# Patient Record
Sex: Female | Born: 1980
Health system: Southern US, Community
[De-identification: ages and names within clinical notes are randomized; demographics above are authoritative.]

## PROBLEM LIST (undated history)

## (undated) DIAGNOSIS — Z8619 Personal history of other infectious and parasitic diseases: Secondary | ICD-10-CM

## (undated) DIAGNOSIS — F419 Anxiety disorder, unspecified: Secondary | ICD-10-CM

## (undated) DIAGNOSIS — O24419 Gestational diabetes mellitus in pregnancy, unspecified control: Secondary | ICD-10-CM

## (undated) DIAGNOSIS — F40243 Fear of flying: Secondary | ICD-10-CM

## (undated) DIAGNOSIS — R87629 Unspecified abnormal cytological findings in specimens from vagina: Secondary | ICD-10-CM

## (undated) DIAGNOSIS — Q699 Polydactyly, unspecified: Secondary | ICD-10-CM

## (undated) DIAGNOSIS — G44209 Tension-type headache, unspecified, not intractable: Secondary | ICD-10-CM

## (undated) DIAGNOSIS — B009 Herpesviral infection, unspecified: Secondary | ICD-10-CM

## (undated) DIAGNOSIS — I8390 Asymptomatic varicose veins of unspecified lower extremity: Secondary | ICD-10-CM

## (undated) HISTORY — DX: Fear of flying: F40.243

## (undated) HISTORY — DX: Polydactyly, unspecified: Q69.9

## (undated) HISTORY — DX: Unspecified abnormal cytological findings in specimens from vagina: R87.629

## (undated) HISTORY — DX: Personal history of other infectious and parasitic diseases: Z86.19

## (undated) HISTORY — DX: Gestational diabetes mellitus in pregnancy, unspecified control: O24.419

## (undated) HISTORY — DX: Herpesviral infection, unspecified: B00.9

## (undated) HISTORY — DX: Asymptomatic varicose veins of unspecified lower extremity: I83.90

## (undated) HISTORY — PX: NO PAST SURGERIES: SHX2092

## (undated) HISTORY — DX: Anxiety disorder, unspecified: F41.9

## (undated) HISTORY — DX: Tension-type headache, unspecified, not intractable: G44.209

---

## 2002-09-24 ENCOUNTER — Other Ambulatory Visit: Admission: RE | Admit: 2002-09-24 | Discharge: 2002-09-24 | Payer: Self-pay | Admitting: Obstetrics and Gynecology

## 2003-02-13 ENCOUNTER — Inpatient Hospital Stay (HOSPITAL_COMMUNITY): Admission: AD | Admit: 2003-02-13 | Discharge: 2003-02-13 | Payer: Self-pay | Admitting: Obstetrics and Gynecology

## 2003-02-17 ENCOUNTER — Inpatient Hospital Stay (HOSPITAL_COMMUNITY): Admission: AD | Admit: 2003-02-17 | Discharge: 2003-02-18 | Payer: Self-pay | Admitting: Obstetrics and Gynecology

## 2003-02-22 ENCOUNTER — Inpatient Hospital Stay (HOSPITAL_COMMUNITY): Admission: AD | Admit: 2003-02-22 | Discharge: 2003-02-22 | Payer: Self-pay | Admitting: Obstetrics and Gynecology

## 2003-02-22 ENCOUNTER — Encounter: Payer: Self-pay | Admitting: Obstetrics and Gynecology

## 2003-03-18 ENCOUNTER — Inpatient Hospital Stay (HOSPITAL_COMMUNITY): Admission: AD | Admit: 2003-03-18 | Discharge: 2003-03-19 | Payer: Self-pay | Admitting: *Deleted

## 2003-03-22 ENCOUNTER — Inpatient Hospital Stay (HOSPITAL_COMMUNITY): Admission: AD | Admit: 2003-03-22 | Discharge: 2003-03-23 | Payer: Self-pay | Admitting: Obstetrics and Gynecology

## 2003-03-31 ENCOUNTER — Inpatient Hospital Stay (HOSPITAL_COMMUNITY): Admission: AD | Admit: 2003-03-31 | Discharge: 2003-03-31 | Payer: Self-pay | Admitting: Obstetrics and Gynecology

## 2003-04-07 ENCOUNTER — Inpatient Hospital Stay (HOSPITAL_COMMUNITY): Admission: AD | Admit: 2003-04-07 | Discharge: 2003-04-07 | Payer: Self-pay | Admitting: Obstetrics and Gynecology

## 2003-04-17 ENCOUNTER — Inpatient Hospital Stay (HOSPITAL_COMMUNITY): Admission: AD | Admit: 2003-04-17 | Discharge: 2003-04-19 | Payer: Self-pay | Admitting: Obstetrics and Gynecology

## 2003-05-14 ENCOUNTER — Inpatient Hospital Stay (HOSPITAL_COMMUNITY): Admission: AD | Admit: 2003-05-14 | Discharge: 2003-05-14 | Payer: Self-pay | Admitting: Obstetrics and Gynecology

## 2003-06-27 ENCOUNTER — Other Ambulatory Visit: Admission: RE | Admit: 2003-06-27 | Discharge: 2003-06-27 | Payer: Self-pay | Admitting: Obstetrics and Gynecology

## 2003-11-24 ENCOUNTER — Emergency Department (HOSPITAL_COMMUNITY): Admission: EM | Admit: 2003-11-24 | Discharge: 2003-11-24 | Payer: Self-pay | Admitting: Emergency Medicine

## 2005-08-11 ENCOUNTER — Ambulatory Visit: Payer: Self-pay | Admitting: Family Medicine

## 2005-08-25 ENCOUNTER — Ambulatory Visit: Payer: Self-pay | Admitting: Family Medicine

## 2005-08-25 ENCOUNTER — Other Ambulatory Visit: Admission: RE | Admit: 2005-08-25 | Discharge: 2005-08-25 | Payer: Self-pay | Admitting: Family Medicine

## 2005-11-09 ENCOUNTER — Ambulatory Visit: Payer: Self-pay | Admitting: Family Medicine

## 2006-03-10 ENCOUNTER — Ambulatory Visit: Payer: Self-pay | Admitting: Family Medicine

## 2006-03-22 DIAGNOSIS — G44209 Tension-type headache, unspecified, not intractable: Secondary | ICD-10-CM | POA: Insufficient documentation

## 2006-12-20 ENCOUNTER — Telehealth: Payer: Self-pay | Admitting: Family Medicine

## 2007-02-21 ENCOUNTER — Ambulatory Visit: Payer: Self-pay | Admitting: Family Medicine

## 2007-02-21 DIAGNOSIS — N920 Excessive and frequent menstruation with regular cycle: Secondary | ICD-10-CM | POA: Insufficient documentation

## 2007-02-21 LAB — CONVERTED CEMR LAB: Beta hcg, urine, semiquantitative: NEGATIVE

## 2007-05-15 ENCOUNTER — Ambulatory Visit: Payer: Self-pay | Admitting: Family Medicine

## 2007-05-15 ENCOUNTER — Encounter: Admission: RE | Admit: 2007-05-15 | Discharge: 2007-05-15 | Payer: Self-pay | Admitting: Family Medicine

## 2007-05-15 DIAGNOSIS — R042 Hemoptysis: Secondary | ICD-10-CM | POA: Insufficient documentation

## 2007-05-15 DIAGNOSIS — M25519 Pain in unspecified shoulder: Secondary | ICD-10-CM | POA: Insufficient documentation

## 2007-05-15 DIAGNOSIS — M79609 Pain in unspecified limb: Secondary | ICD-10-CM | POA: Insufficient documentation

## 2007-05-16 LAB — CONVERTED CEMR LAB
ALT: 17 U/L
AST: 16 U/L
Albumin: 4.4 g/dL
Alkaline Phosphatase: 66 U/L
BUN: 15 mg/dL
CO2: 24 meq/L
Calcium: 9 mg/dL
Chloride: 104 meq/L
Creatinine, Ser: 0.76 mg/dL
Glucose, Bld: 96 mg/dL
HCT: 41 %
Hemoglobin: 13.5 g/dL
MCHC: 32.9 g/dL
MCV: 86.3 fL
Platelets: 260 K/uL
Potassium: 4.4 meq/L
RBC: 4.75 M/uL
RDW: 12.6 %
Rheumatoid fact SerPl-aCnc: <20 [IU]/mL
Sed Rate: 6 mm/h
Sodium: 138 meq/L
TSH: 0.827 u[IU]/mL
Total Bilirubin: 0.4 mg/dL
Total Protein: 7.1 g/dL
WBC: 5.1 10*3/microliter

## 2007-05-19 ENCOUNTER — Telehealth (INDEPENDENT_AMBULATORY_CARE_PROVIDER_SITE_OTHER): Payer: Self-pay | Admitting: *Deleted

## 2007-05-25 ENCOUNTER — Ambulatory Visit: Payer: Self-pay | Admitting: Family Medicine

## 2007-06-29 ENCOUNTER — Encounter: Payer: Self-pay | Admitting: Family Medicine

## 2007-08-11 ENCOUNTER — Telehealth (INDEPENDENT_AMBULATORY_CARE_PROVIDER_SITE_OTHER): Payer: Self-pay | Admitting: *Deleted

## 2008-07-04 ENCOUNTER — Ambulatory Visit: Payer: Self-pay | Admitting: Family Medicine

## 2008-07-04 DIAGNOSIS — F409 Phobic anxiety disorder, unspecified: Secondary | ICD-10-CM | POA: Insufficient documentation

## 2008-07-04 DIAGNOSIS — B009 Herpesviral infection, unspecified: Secondary | ICD-10-CM | POA: Insufficient documentation

## 2009-03-14 ENCOUNTER — Other Ambulatory Visit: Admission: RE | Admit: 2009-03-14 | Discharge: 2009-03-14 | Payer: Self-pay | Admitting: Family Medicine

## 2011-09-06 ENCOUNTER — Other Ambulatory Visit (HOSPITAL_COMMUNITY)
Admission: RE | Admit: 2011-09-06 | Discharge: 2011-09-06 | Disposition: A | Discharge status: Home / Self Care | Payer: BC Managed Care – PPO | Source: Ambulatory Visit | Attending: Obstetrics and Gynecology | Admitting: Obstetrics and Gynecology

## 2011-09-06 DIAGNOSIS — Z1159 Encounter for screening for other viral diseases: Secondary | ICD-10-CM | POA: Insufficient documentation

## 2011-09-06 DIAGNOSIS — Z01419 Encounter for gynecological examination (general) (routine) without abnormal findings: Secondary | ICD-10-CM | POA: Insufficient documentation

## 2014-03-29 ENCOUNTER — Other Ambulatory Visit (HOSPITAL_COMMUNITY)
Admission: RE | Admit: 2014-03-29 | Discharge: 2014-03-29 | Disposition: A | Discharge status: Home / Self Care | Payer: BC Managed Care – PPO | Source: Ambulatory Visit | Attending: Obstetrics and Gynecology | Admitting: Obstetrics and Gynecology

## 2014-03-29 ENCOUNTER — Other Ambulatory Visit: Payer: Self-pay | Admitting: Obstetrics and Gynecology

## 2014-03-29 DIAGNOSIS — Z1151 Encounter for screening for human papillomavirus (HPV): Secondary | ICD-10-CM | POA: Insufficient documentation

## 2014-03-29 DIAGNOSIS — Z01419 Encounter for gynecological examination (general) (routine) without abnormal findings: Secondary | ICD-10-CM | POA: Diagnosis not present

## 2014-04-01 LAB — CYTOLOGY - PAP

## 2014-04-12 ENCOUNTER — Other Ambulatory Visit: Payer: Self-pay | Admitting: Obstetrics & Gynecology

## 2014-06-14 NOTE — L&D Delivery Note (Signed)
Final Labor Progress Note VE at 0345 C/C/+2.  Pt sleeping, no urge to push, allowed to labor down  Vaginal Delivery Note The pt utilized an epidural as pain management.   Spontaneous rupture of membranes today, at 2348, clear.  GBS negative.  Cervical dilation was complete at  0345.  NICHD Category 1.    Pushing with guidance began at  0446.   After 11 minutes of pushing the head, shoulders and the body of a viable female infant "Eulah Pont" delivered spontaneously with maternal effort in the ROA position at 0457.  With vigorous tone and spontaneous cry, the infant was placed on moms abd.  After the umbilical cord was clamped it was cut by the FOB, then cord blood was obtained for evaluation.  Spontaneous delivery of a intact placenta with a 3 vessel cord via Shultz at [phone].   Episiotomy: None   The vulva, perineum, vaginal vault, rectum and cervix were inspected and revealed a periclitoral and a right sulcus which was repaired using a 4-0 vicryl on a SH needle.  Lidocaine was not used, the epidural was sufficient for the repair.    Postpartum pitocin as ordered.  Fundus firm, lochia minimum, bleeding under control. EBL 150, QBL pending. Pt hemodynamically stable.   Sponge, laps and needle count correct and verified with the primary care nurse.  Attending MD available at all times.    Routine postpartum orders   Mother unsure about method of contraception, considering IUD   Placenta to pathology: NO     Cord Gases sent to lab: NO Cord blood sent to lab: YES   APGARS:  9 at 1 minute and 9 at 5 minutes Weight:. pending     Both mom and baby were left in stable condition, baby skin to skin.      Yenni Carra, CNM, MSN 02/16/2015. 5:37 AM

## 2014-07-25 LAB — OB RESULTS CONSOLE ABO/RH: RH Type: POSITIVE

## 2014-07-25 LAB — OB RESULTS CONSOLE HIV ANTIBODY (ROUTINE TESTING): HIV: NONREACTIVE

## 2014-07-25 LAB — OB RESULTS CONSOLE HEPATITIS B SURFACE ANTIGEN: Hepatitis B Surface Ag: NEGATIVE

## 2014-07-25 LAB — OB RESULTS CONSOLE GC/CHLAMYDIA
Chlamydia: NEGATIVE
Gonorrhea: NEGATIVE

## 2014-07-25 LAB — OB RESULTS CONSOLE ANTIBODY SCREEN: Antibody Screen: NEGATIVE

## 2014-07-25 LAB — OB RESULTS CONSOLE RUBELLA ANTIBODY, IGM: Rubella: IMMUNE

## 2014-07-25 LAB — OB RESULTS CONSOLE RPR: RPR: NONREACTIVE

## 2014-12-11 ENCOUNTER — Encounter: Payer: BLUE CROSS/BLUE SHIELD | Attending: Obstetrics and Gynecology

## 2014-12-11 VITALS — Ht 61.0 in | Wt 193.0 lb

## 2014-12-11 DIAGNOSIS — Z713 Dietary counseling and surveillance: Secondary | ICD-10-CM | POA: Diagnosis not present

## 2014-12-11 DIAGNOSIS — O24410 Gestational diabetes mellitus in pregnancy, diet controlled: Secondary | ICD-10-CM | POA: Diagnosis present

## 2014-12-11 NOTE — Progress Notes (Signed)
  Patient was seen on 12/11/14 for Gestational Diabetes self-management class at the [facility]. The following learning objectives were met by the patient during this course:   States the definition of Gestational Diabetes  States why dietary management is important in controlling blood glucose  Describes the effects each nutrient has on blood glucose levels  Demonstrates ability to create a balanced meal plan  Demonstrates carbohydrate counting   States when to check blood glucose levels  Demonstrates proper blood glucose monitoring techniques  States the effect of stress and exercise on blood glucose levels  States the importance of limiting caffeine and abstaining from alcohol and smoking  Blood glucose monitor given: NO, patient has her own meter already  Patient instructed to monitor glucose levels: FBS: 60 - <90 2 hour: <120  *Patient received handouts:  Nutrition Diabetes and Pregnancy  Carbohydrate Counting List  Patient will be seen for follow-up as needed.

## 2015-02-13 ENCOUNTER — Telehealth (HOSPITAL_COMMUNITY): Payer: Self-pay | Admitting: *Deleted

## 2015-02-13 ENCOUNTER — Encounter (HOSPITAL_COMMUNITY): Payer: Self-pay | Admitting: *Deleted

## 2015-02-13 LAB — OB RESULTS CONSOLE GBS: GBS: NEGATIVE

## 2015-02-13 NOTE — Telephone Encounter (Signed)
Preadmission screen  

## 2015-02-15 ENCOUNTER — Inpatient Hospital Stay (HOSPITAL_COMMUNITY): Payer: Medicaid Other | Admitting: Anesthesiology

## 2015-02-15 ENCOUNTER — Encounter (HOSPITAL_COMMUNITY): Payer: Self-pay

## 2015-02-15 ENCOUNTER — Inpatient Hospital Stay (HOSPITAL_COMMUNITY)
Admission: AD | Admit: 2015-02-15 | Discharge: 2015-02-17 | DRG: 775 | Disposition: A | Discharge status: Home / Self Care | Payer: Medicaid Other | Source: Ambulatory Visit | Attending: Obstetrics and Gynecology | Admitting: Obstetrics and Gynecology

## 2015-02-15 DIAGNOSIS — O24420 Gestational diabetes mellitus in childbirth, diet controlled: Secondary | ICD-10-CM | POA: Diagnosis present

## 2015-02-15 DIAGNOSIS — Z833 Family history of diabetes mellitus: Secondary | ICD-10-CM

## 2015-02-15 DIAGNOSIS — O9962 Diseases of the digestive system complicating childbirth: Secondary | ICD-10-CM | POA: Diagnosis present

## 2015-02-15 DIAGNOSIS — O09523 Supervision of elderly multigravida, third trimester: Secondary | ICD-10-CM

## 2015-02-15 DIAGNOSIS — Z3A38 38 weeks gestation of pregnancy: Secondary | ICD-10-CM | POA: Diagnosis present

## 2015-02-15 DIAGNOSIS — K219 Gastro-esophageal reflux disease without esophagitis: Secondary | ICD-10-CM | POA: Diagnosis present

## 2015-02-15 LAB — CBC
HEMATOCRIT: 36.2 % (ref 36.0–46.0)
HEMOGLOBIN: 12.4 g/dL (ref 12.0–15.0)
MCH: 27.4 pg (ref 26.0–34.0)
MCHC: 34.3 g/dL (ref 30.0–36.0)
MCV: 80.1 fL (ref 78.0–100.0)
Platelets: 169 10*3/uL (ref 150–400)
RBC: 4.52 MIL/uL (ref 3.87–5.11)
RDW: 14.2 % (ref 11.5–15.5)
WBC: 7.8 10*3/uL (ref 4.0–10.5)

## 2015-02-15 LAB — TYPE AND SCREEN
ABO/RH(D): AB POS
Antibody Screen: NEGATIVE

## 2015-02-15 MED ORDER — FENTANYL 2.5 MCG/ML BUPIVACAINE 1/10 % EPIDURAL INFUSION (WH - ANES)
12.0000 mL/h | INTRAMUSCULAR | Status: DC | PRN
  Administered 2015-02-15: 12 mL/h via EPIDURAL

## 2015-02-15 MED ORDER — DIPHENHYDRAMINE HCL 50 MG/ML IJ SOLN
12.5000 mg | INTRAMUSCULAR | Status: DC | PRN
Start: 2015-02-15 — End: 2015-02-16

## 2015-02-15 MED ORDER — EPHEDRINE 5 MG/ML INJ
10.0000 mg | INTRAVENOUS | Status: DC | PRN
  Filled 2015-02-15: qty 2

## 2015-02-15 MED ORDER — CITRIC ACID-SODIUM CITRATE 334-500 MG/5ML PO SOLN: 30.0000 mL | ORAL | Status: DC | PRN

## 2015-02-15 MED ORDER — NALBUPHINE HCL 10 MG/ML IJ SOLN
5.0000 mg | INTRAMUSCULAR | Status: DC | PRN
  Administered 2015-02-15: 5 mg via INTRAVENOUS
  Filled 2015-02-15 (×2): qty 0.5

## 2015-02-15 MED ORDER — DIPHENHYDRAMINE HCL 50 MG/ML IJ SOLN: 12.5000 mg | INTRAMUSCULAR | Status: DC | PRN

## 2015-02-15 MED ORDER — LIDOCAINE HCL (PF) 1 % IJ SOLN
30.0000 mL | INTRAMUSCULAR | Status: DC | PRN
  Filled 2015-02-15: qty 30

## 2015-02-15 MED ORDER — PHENYLEPHRINE 40 MCG/ML (10ML) SYRINGE FOR IV PUSH (FOR BLOOD PRESSURE SUPPORT): 80.0000 ug | PREFILLED_SYRINGE | INTRAVENOUS | Status: DC | PRN

## 2015-02-15 MED ORDER — LACTATED RINGERS IV SOLN
INTRAVENOUS | Status: DC
  Administered 2015-02-15: 23:00:00 via INTRAVENOUS

## 2015-02-15 MED ORDER — ONDANSETRON HCL 4 MG/2ML IJ SOLN
4.0000 mg | Freq: Four times a day (QID) | INTRAMUSCULAR | Status: DC | PRN
  Administered 2015-02-16: 4 mg via INTRAVENOUS
  Filled 2015-02-15: qty 2

## 2015-02-15 MED ORDER — OXYTOCIN 40 UNITS IN LACTATED RINGERS INFUSION - SIMPLE MED
62.5000 mL/h | INTRAVENOUS | Status: DC
  Filled 2015-02-15: qty 1000

## 2015-02-15 MED ORDER — LIDOCAINE HCL (PF) 1 % IJ SOLN
INTRAMUSCULAR | Status: DC | PRN
  Administered 2015-02-15 (×2): 4 mL via EPIDURAL

## 2015-02-15 MED ORDER — OXYCODONE-ACETAMINOPHEN 5-325 MG PO TABS: 1.0000 | ORAL_TABLET | ORAL | Status: DC | PRN

## 2015-02-15 MED ORDER — OXYCODONE-ACETAMINOPHEN 5-325 MG PO TABS: 2.0000 | ORAL_TABLET | ORAL | Status: DC | PRN

## 2015-02-15 MED ORDER — LACTATED RINGERS IV SOLN
500.0000 mL | INTRAVENOUS | Status: DC | PRN
  Administered 2015-02-15: 500 mL via INTRAVENOUS

## 2015-02-15 MED ORDER — ACETAMINOPHEN 325 MG PO TABS: 650.0000 mg | ORAL_TABLET | ORAL | Status: DC | PRN

## 2015-02-15 MED ORDER — PHENYLEPHRINE 40 MCG/ML (10ML) SYRINGE FOR IV PUSH (FOR BLOOD PRESSURE SUPPORT)
80.0000 ug | PREFILLED_SYRINGE | INTRAVENOUS | Status: DC | PRN
  Filled 2015-02-15: qty 20
  Filled 2015-02-15: qty 2

## 2015-02-15 MED ORDER — FENTANYL 2.5 MCG/ML BUPIVACAINE 1/10 % EPIDURAL INFUSION (WH - ANES)
14.0000 mL/h | INTRAMUSCULAR | Status: DC | PRN
  Filled 2015-02-15: qty 125

## 2015-02-15 MED ORDER — OXYTOCIN BOLUS FROM INFUSION
500.0000 mL | INTRAVENOUS | Status: DC
  Administered 2015-02-16: 500 mL via INTRAVENOUS

## 2015-02-15 NOTE — MAU Note (Signed)
Pt presents complaining of contractions every 5-8 minutes with bloody show. Denies leaking. Reports good fetal movement.

## 2015-02-15 NOTE — Anesthesia Procedure Notes (Signed)
Epidural Patient location during procedure: OB Start time: 02/15/2015 11:34 PM  Staffing Anesthesiologist: Mal Amabile Performed by: anesthesiologist   Preanesthetic Checklist Completed: patient identified, site marked, surgical consent, pre-op evaluation, timeout performed, IV checked, risks and benefits discussed and monitors and equipment checked  Epidural Patient position: sitting Prep: site prepped and draped and DuraPrep Patient monitoring: continuous pulse ox and blood pressure Approach: midline Location: L3-L4 Injection technique: LOR air  Needle:  Needle type: Tuohy  Needle gauge: 17 G Needle length: 9 cm and 9 Needle insertion depth: 5 cm cm Catheter type: closed end flexible Catheter size: 19 Gauge Catheter at skin depth: 10 cm Test dose: negative and Other  Assessment Events: blood not aspirated, injection not painful, no injection resistance, negative IV test and no paresthesia  Additional Notes Patient identified. Risks and benefits discussed including failed block, incomplete  Pain control, post dural puncture headache, nerve damage, paralysis, blood pressure Changes, nausea, vomiting, reactions to medications-both toxic and allergic and post Partum back pain. All questions were answered. Patient expressed understanding and wished to proceed. Sterile technique was used throughout procedure. Epidural site was Dressed with sterile barrier dressing. No paresthesias, signs of intravascular injection Or signs of intrathecal spread were encountered.  Patient was more comfortable after the epidural was dosed. Please see RN's note for documentation of vital signs and FHR which are stable.

## 2015-02-15 NOTE — Anesthesia Preprocedure Evaluation (Signed)
Anesthesia Evaluation  Patient identified by MRN, date of birth, ID band Patient awake    Reviewed: Allergy & Precautions, Patient's Chart, lab work & pertinent test results  Airway Mallampati: II  TM Distance: >3 FB Neck ROM: Full    Dental no notable dental hx. (+) Teeth Intact   Pulmonary neg pulmonary ROS,  breath sounds clear to auscultation  Pulmonary exam normal       Cardiovascular negative cardio ROS Normal cardiovascular examRhythm:Regular Rate:Normal     Neuro/Psych  Headaches, Anxiety    GI/Hepatic Neg liver ROS, GERD-  ,  Endo/Other  diabetes, Well Controlled, GestationalObesity  Renal/GU negative Renal ROS  negative genitourinary   Musculoskeletal negative musculoskeletal ROS (+)   Abdominal (+) + obese,   Peds  Hematology   Anesthesia Other Findings   Reproductive/Obstetrics (+) Pregnancy HSV                             Anesthesia Physical Anesthesia Plan  ASA: II  Anesthesia Plan: Epidural   Post-op Pain Management:    Induction:   Airway Management Planned: Natural Airway  Additional Equipment:   Intra-op Plan:   Post-operative Plan:   Informed Consent: I have reviewed the patients History and Physical, chart, labs and discussed the procedure including the risks, benefits and alternatives for the proposed anesthesia with the patient or authorized representative who has indicated his/her understanding and acceptance.     Plan Discussed with: Anesthesiologist  Anesthesia Plan Comments:         Anesthesia Quick Evaluation

## 2015-02-15 NOTE — Plan of Care (Signed)
Problem: Consults Goal: Birthing Suites Patient Information Press F2 to bring up selections list Outcome: Completed/Met Date Met:  02/15/15  Pt 37-[redacted] weeks EGA

## 2015-02-15 NOTE — Progress Notes (Signed)
Notified of cervical exam and pt discomfort. Will have patient walk and recheck in 1 hour

## 2015-02-15 NOTE — H&P (Signed)
Cathy Barron is a 34 y.o. female, G5 P3013 at 38.4 weeks arrived to MAU c/o ctx.  She denies vb orlof w/+FM.  She report all of her deliveries were vaginal and uncomplicated. She believes her GBS status is negative.  PN records are not available past 28 weeks but it is noted that GBS is negative on 02/13/15.  Pt report HSV type 1, on Valtrex PRN, no current out break  Patient Active Problem List   Diagnosis Date Noted  . HSV 07/04/2008  . PHOBIA, UNSPECIFIED 07/04/2008  . SHOULDER PAIN, RIGHT 05/15/2007  . HAND PAIN 05/15/2007  . HEMOPTYSIS 05/15/2007  . MENORRHAGIA 02/21/2007  . HEADACHE, TENSION 03/22/2006    Pregnancy Course: Patient entered care at 9.2 weeks.   EDC of 02/25/15 was established by [place]    [place] evaluations:    weeks - Dating:    weeks - Anatomy:      weeks - FU:   Significant prenatal events:   AMA, ETOH - beer   Reason for admission:  labor  Pt States:   Contractions Frequency: 3         Contraction severity: moderate-strong         Fetal activity: +FM  OB History    Gravida Para Term Preterm AB TAB SAB Ectopic Multiple Living   5 3 3  1  1   3      Past Medical History  Diagnosis Date  . GDM (gestational diabetes mellitus)   . Vaginal Pap smear, abnormal   . Varicose vein   . Hx of varicella   . Herpes   . Headache   . Polydactylia   . Gestational diabetes     diet controlled   Past Surgical History  Procedure Laterality Date  . No past surgeries     Family History: family history includes Diabetes in her father; Liver disease in her father; Varicose Veins in her mother. Social History:  reports that she has never smoked. She has never used smokeless tobacco. She reports that she does not drink alcohol or use illicit drugs.   Prenatal Transfer Tool  Maternal Diabetes: Pt denies Genetic Screening:  Maternal Ultrasounds/Referrals:  Fetal Ultrasounds or other Referrals:   Maternal Substance Abuse:   Significant Maternal Medications:  Meds  include: Other: valtrex and Klonipin Significant Maternal Lab Results: None   ROS:  See HPI above, all other systems are negative  No Known Allergies  Dilation: 5 Effacement (%): 100 Station: -3 Exam by:: Sharen Hint RNC Blood pressure 127/78, pulse 76, temperature 98.6 F (37 C), resp. rate 16, last menstrual period 04/23/2014.  Maternal Exam:  Uterine Assessment: Contraction frequency is rare.  Abdomen: Gravid, non tender. Fundal height is aga.  Normal external genitalia, vulva, cervix, uterus and adnexa.  No lesions noted on exam.  Pelvis adequate for delivery.  Fetal presentation: Vertex by Leopold's  Fetal Exam:  Monitor Surveillance : Continuous Monitoring Mode: Ultrasound.  NICHD: Category  CTXs: Q EFW   7 lbs  Physical Exam: Nursing note and vitals reviewed General: alert and cooperative She appears well nourished Psychiatric: Normal mood and affect. Her behavior is normal Head: Normocephalic Eyes: Pupils are equal, round, and reactive to light Neck: Normal range of motion Cardiovascular: RRR without murmur  Respiratory: CTAB. Effort normal  Abd: soft, non-tender, +BS, no rebound, no guarding  Genitourinary: Vagina normal  Neurological: A&Ox3 Skin: Warm and dry  Musculoskeletal: Normal range of motion  Homan's sign negative bilaterally No evidence  of DVTs.  Edema: Minimal bilaterally non-pitting edema DTR: 2+ Clonus: None   Prenatal labs: ABO, Rh: AB/Positive/-- (02/11 0000) Antibody: Negative (02/11 0000) Rubella:   immune RPR: Nonreactive (02/11 0000)  HBsAg: Negative (02/11 0000)  HIV: Non-reactive (02/11 0000)  GBS: Negative (09/01 0000) Sickle cell/Hgb electrophoresis:  WNL Pap:   GC:   Negative Chlamydia: negative Genetic screenings:   Glucola:  GDM   Assessment:  IUP at 38.4 weeks NICHD: Category Membranes: BBW per nurse exam GBS negative  Plan:  Admit to L&D for expectant management of labor. Possible  augmentation options reviewed including foley bulb, AROM and/or pitocin.  IV pain medication per orders PRN Epidural per patient request Foley cath after patient is comfortable with epidural Anticipate SVD Labor mgmt as ordered    Attending MD available at all times.  Zosia Lucchese, CNM, MSN 02/15/2015, 10:57 PM

## 2015-02-16 ENCOUNTER — Encounter (HOSPITAL_COMMUNITY): Payer: Self-pay

## 2015-02-16 LAB — GLUCOSE, CAPILLARY
Glucose-Capillary: 77 mg/dL (ref 65–99)
Glucose-Capillary: 90 mg/dL (ref 65–99)

## 2015-02-16 LAB — ABO/RH: ABO/RH(D): AB POS

## 2015-02-16 LAB — RPR: RPR: NONREACTIVE

## 2015-02-16 MED ORDER — PRENATAL MULTIVITAMIN CH
1.0000 | ORAL_TABLET | Freq: Every day | ORAL | Status: DC
  Administered 2015-02-16 – 2015-02-17 (×2): 1 via ORAL
  Filled 2015-02-16 (×2): qty 1

## 2015-02-16 MED ORDER — SIMETHICONE 80 MG PO CHEW: 80.0000 mg | CHEWABLE_TABLET | ORAL | Status: DC | PRN

## 2015-02-16 MED ORDER — IBUPROFEN 600 MG PO TABS
600.0000 mg | ORAL_TABLET | Freq: Four times a day (QID) | ORAL | Status: DC
  Administered 2015-02-16 – 2015-02-17 (×7): 600 mg via ORAL
  Filled 2015-02-16 (×7): qty 1

## 2015-02-16 MED ORDER — ACETAMINOPHEN 325 MG PO TABS: 650.0000 mg | ORAL_TABLET | ORAL | Status: DC | PRN

## 2015-02-16 MED ORDER — BENZOCAINE-MENTHOL 20-0.5 % EX AERO
1.0000 "application " | INHALATION_SPRAY | CUTANEOUS | Status: DC | PRN
  Administered 2015-02-16: 1 via TOPICAL
  Filled 2015-02-16: qty 56

## 2015-02-16 MED ORDER — TETANUS-DIPHTH-ACELL PERTUSSIS 5-2.5-18.5 LF-MCG/0.5 IM SUSP: 0.5000 mL | Freq: Once | INTRAMUSCULAR | Status: DC

## 2015-02-16 MED ORDER — INFLUENZA VAC SPLIT QUAD 0.5 ML IM SUSY
0.5000 mL | PREFILLED_SYRINGE | INTRAMUSCULAR | Status: AC
  Administered 2015-02-17: 0.5 mL via INTRAMUSCULAR

## 2015-02-16 MED ORDER — OXYCODONE-ACETAMINOPHEN 5-325 MG PO TABS: 2.0000 | ORAL_TABLET | ORAL | Status: DC | PRN

## 2015-02-16 MED ORDER — DIPHENHYDRAMINE HCL 25 MG PO CAPS: 25.0000 mg | ORAL_CAPSULE | Freq: Four times a day (QID) | ORAL | Status: DC | PRN

## 2015-02-16 MED ORDER — OXYCODONE-ACETAMINOPHEN 5-325 MG PO TABS: 1.0000 | ORAL_TABLET | ORAL | Status: DC | PRN

## 2015-02-16 MED ORDER — LANOLIN HYDROUS EX OINT: TOPICAL_OINTMENT | CUTANEOUS | Status: DC | PRN

## 2015-02-16 MED ORDER — ZOLPIDEM TARTRATE 5 MG PO TABS: 5.0000 mg | ORAL_TABLET | Freq: Every evening | ORAL | Status: DC | PRN

## 2015-02-16 MED ORDER — ONDANSETRON HCL 4 MG/2ML IJ SOLN: 4.0000 mg | INTRAMUSCULAR | Status: DC | PRN

## 2015-02-16 MED ORDER — ONDANSETRON HCL 4 MG PO TABS: 4.0000 mg | ORAL_TABLET | ORAL | Status: DC | PRN

## 2015-02-16 MED ORDER — SENNOSIDES-DOCUSATE SODIUM 8.6-50 MG PO TABS
2.0000 | ORAL_TABLET | ORAL | Status: DC
  Administered 2015-02-16: 2 via ORAL
  Filled 2015-02-16: qty 2

## 2015-02-16 MED ORDER — WITCH HAZEL-GLYCERIN EX PADS
1.0000 | MEDICATED_PAD | CUTANEOUS | Status: DC | PRN
Start: 2015-02-16 — End: 2015-02-17

## 2015-02-16 MED ORDER — DIBUCAINE 1 % RE OINT: 1.0000 "application " | TOPICAL_OINTMENT | RECTAL | Status: DC | PRN

## 2015-02-16 NOTE — Lactation Note (Signed)
This note was copied from the chart of Cathy Barron. Lactation Consultation Note Experienced BF mom who had trouble BF all of her some d/t latching issues. Mom has everted nipples, needs more stimulation w/finger tips d/t short shaft nipples. Encouraged to wear shells to assist in everting nipples more. Fitted mom w/#20 NS. Application taught. Mom has large pendulum breast, elevated w/cloth for support. Baby had BF prior to my visit. To sleepy to latch at this time.  Discussed positioning, obtaining deep latching, chin tug, I&O, new born behavior, cluster feeding, supply and demand.  Referred to Baby and Me Book in Breastfeeding section Pg. 22-23 for position options and Proper latch demonstration.Mom encouraged to feed baby 8-12 times/24 hours and with feeding cues. Mom encouraged to waken baby for feeds. Mom encouraged to do skin-to-skin. WH/LC brochure given w/resources, support groups and LC services. Patient Name: Cathy Barron Today's Date: 02/16/2015 Reason for consult: Initial assessment   Maternal Data Has patient been taught Hand Expression?: Yes Does the patient have breastfeeding experience prior to this delivery?: Yes  Feeding Feeding Type: Breast Fed Length of feed: 10 min  LATCH Score/Interventions Latch: Too sleepy or reluctant, no latch achieved, no sucking elicited. Intervention(s): Skin to skin;Teach feeding cues;Waking techniques Intervention(s): Adjust position;Assist with latch;Breast massage;Breast compression  Audible Swallowing: None Intervention(s): Skin to skin;Hand expression  Type of Nipple: Everted at rest and after stimulation  Comfort (Breast/Nipple): Soft / non-tender  Problem noted: Mild/Moderate discomfort  Hold (Positioning): Assistance needed to correctly position infant at breast and maintain latch. Intervention(s): Breastfeeding basics reviewed;Support Pillows;Position options;Skin to skin  LATCH Score: 5  Lactation Tools  Discussed/Used Tools: Shells;Nipple Shields Nipple shield size: 20 Shell Type: Inverted   Consult Status Consult Status: Follow-up Date: 02/17/15 Follow-up type: In-patient    Charyl Dancer 02/16/2015, 6:32 PM

## 2015-02-16 NOTE — Progress Notes (Signed)
Labor Progress  Subjective: No complaints, comfortable with epidural  Objective: BP 127/76 mmHg  Pulse 69  Temp(Src) 98.6 F (37 C)  Resp 18  Ht  (1.549 m)  Wt 189 lb (85.73 kg)  BMI 35.73 kg/m2  SpO2 98%  LMP 04/23/2014     FHT: 110, moderate variability, + accel, no decels CTX:  regular, every 3-4 minutes Uterus gravid, soft non tender SVE:  8-9/80/-1   Assessment:  IUP at 38.5 weeks NICHD: Category  1 Membranes:  SROM x 1hrs, no s/s of infection Labor progress: adquate labor GBS:  negative A1GDM: stable sugars 98   Plan: Continue labor plan Continuous monitoring Frequent position changes to facilitate fetal rotation and descent. Will reassess with cervical exam at 0300 or earlier if necessary       Cathy Barron, CNM, MSN 02/16/2015. 1:41 AM

## 2015-02-16 NOTE — Anesthesia Postprocedure Evaluation (Signed)
  Anesthesia Post-op Note  Patient: Cathy Barron  Procedure(s) Performed: * No procedures listed *  Patient Location: Mother/Baby  Anesthesia Type:Epidural  Level of Consciousness: awake  Airway and Oxygen Therapy: Patient Spontanous Breathing  Post-op Pain: mild  Post-op Assessment: Post-op Vital signs reviewed and Patient's Cardiovascular Status Stable              Post-op Vital Signs: stable  Last Vitals:  Filed Vitals:   02/16/15 1308  BP: 133/76  Pulse: 61  Temp: 36.8 C  Resp: 16    Complications: No apparent anesthesia complications

## 2015-02-17 LAB — CBC
HCT: 31.1 % — ABNORMAL LOW (ref 36.0–46.0)
Hemoglobin: 10.4 g/dL — ABNORMAL LOW (ref 12.0–15.0)
MCH: 27.4 pg (ref 26.0–34.0)
MCHC: 33.4 g/dL (ref 30.0–36.0)
MCV: 81.8 fL (ref 78.0–100.0)
PLATELETS: 131 10*3/uL — AB (ref 150–400)
RBC: 3.8 MIL/uL — AB (ref 3.87–5.11)
RDW: 14.5 % (ref 11.5–15.5)
WBC: 9.8 10*3/uL (ref 4.0–10.5)

## 2015-02-17 MED ORDER — IBUPROFEN 600 MG PO TABS: 600.0000 mg | ORAL_TABLET | Freq: Four times a day (QID) | ORAL | Status: AC | PRN

## 2015-02-17 NOTE — Discharge Instructions (Signed)

## 2015-02-17 NOTE — Discharge Summary (Signed)
  Vaginal Delivery Discharge Summary  Cathy Barron  DOB:    1980-12-30 MRN:    [id number] CSN:    [id number]  Date of admission:                  02/15/15  Date of discharge:                   02/17/15  Procedures this admission:   SVB, repair of periclitoral and right sulcus tear  Date of Delivery: 02/16/15  Newborn Data:  Live born female  Birth Weight: 7 lb 3.3 oz (3268 g) APGAR: 9, 9  Home with mother. Name: Cathy Barron   History of Present Illness:  Ms. Cathy Barron is a 34 y.o. female, [id number], who presents at [redacted]w[redacted]d weeks gestation. The patient has been followed at [facility] by Dr. Dion Body.  She was admitted for onset of labor. Her pregnancy has been complicated by:  Patient Active Problem List   Diagnosis Date Noted  . Normal vaginal delivery 02/16/2015  . HSV 07/04/2008  . PHOBIA, UNSPECIFIED 07/04/2008  . SHOULDER PAIN, RIGHT 05/15/2007  . HAND PAIN 05/15/2007  . HEMOPTYSIS 05/15/2007  . MENORRHAGIA 02/21/2007  . HEADACHE, TENSION 03/22/2006     Hospital Course:   Admitting Dx:  IUP at 38 5/7 weeks, active labor. GBS Status:  Negative Delivering Clinician:  Venus Standard, CNM Lacerations/MLE: periclitoral and right sulcus tear Complications: None  Intrapartum Procedures: spontaneous vaginal delivery Postpartum Procedures: none Complications-Operative and Postpartum: periclitoral and right sulcus tear  Discharge Diagnoses: Term Pregnancy-delivered, SVB  Feeding:  breast  Contraception:  Considering IUD  Hemoglobin Results:  CBC Latest Ref Rng 02/17/2015 02/15/2015 05/15/2007  WBC 4.0 - 10.5 K/uL 9.8 7.8 5.1  Hemoglobin 12.0 - 15.0 g/dL 10.4(L) 12.4 13.5  Hematocrit 36.0 - 46.0 % 31.1(L) 36.2 41.0  Platelets 150 - 400 K/uL 131(L) 169 260    Discharge Physical Exam:   General: alert Lochia: appropriate Uterine Fundus: firm Incision: healing well DVT Evaluation: No evidence of DVT seen on physical exam. Negative Homan's  sign.   Discharge Information:  Activity:           pelvic rest Diet:                routine Medications: Ibuprofen and continue Atarax Condition:      stable Instructions:  Routine pp instructions   Discharge to: home  Follow-up Information    Follow up with [facility]. Schedule an appointment as soon as possible for a visit in 6 weeks.   Specialty:  Obstetrics and Gynecology   Why:  Call for any questions or concerns.   Contact information:   [address] [place] [address] [phone]        Nigel Bridgeman CNM 02/17/2015 9:08 AM

## 2015-02-18 ENCOUNTER — Ambulatory Visit: Payer: Self-pay

## 2015-02-18 ENCOUNTER — Inpatient Hospital Stay (HOSPITAL_COMMUNITY): Admission: RE | Admit: 2015-02-18 | Payer: BLUE CROSS/BLUE SHIELD | Source: Ambulatory Visit

## 2015-02-18 NOTE — Lactation Note (Signed)
This note was copied from the chart of Cathy Dianah Turpin. Lactation Consultation Note  Baby latched in cross cradle position upon entering with #24NS. Sucks and some swallows observed.  Mother states she does not need OP appt. Provided mother w/ comfort gels for sore nipples and an extra #24NS. Reminded mother to try at least once a day without NS and post pump 4-6x a day and give baby back volume pumped. Discussed engorgement care and monitoring voids/stools.  Patient Name: Cathy Barron [id number] Date: 02/18/2015 Reason for consult: Follow-up assessment   Maternal Data    Feeding Feeding Type: Breast Fed Length of feed: 20 min  LATCH Score/Interventions Latch: Grasps breast easily, tongue down, lips flanged, rhythmical sucking. Intervention(s): Skin to skin  Audible Swallowing: A few with stimulation Intervention(s): Skin to skin;Hand expression Intervention(s): Skin to skin;Hand expression (nipple shield)  Type of Nipple: Flat Intervention(s): Double electric pump  Comfort (Breast/Nipple): Filling, red/small blisters or bruises, mild/mod discomfort  Problem noted: Mild/Moderate discomfort Interventions (Mild/moderate discomfort): Hand expression;Breast shields  Hold (Positioning): No assistance needed to correctly position infant at breast. Intervention(s): Breastfeeding basics reviewed;Support Pillows;Skin to skin  LATCH Score: 7  Lactation Tools Discussed/Used     Consult Status Consult Status: Complete    Hardie Pulley 02/18/2015, 10:44 AM

## 2015-02-25 ENCOUNTER — Inpatient Hospital Stay (HOSPITAL_COMMUNITY): Admission: AD | Admit: 2015-02-25 | Payer: Self-pay | Source: Ambulatory Visit | Admitting: Obstetrics and Gynecology

## 2016-03-09 ENCOUNTER — Encounter: Payer: Self-pay | Admitting: Family Medicine

## 2016-03-09 ENCOUNTER — Ambulatory Visit (INDEPENDENT_AMBULATORY_CARE_PROVIDER_SITE_OTHER): Payer: BLUE CROSS/BLUE SHIELD | Admitting: Family Medicine

## 2016-03-09 VITALS — BP 113/77 | HR 76 | Temp 97.8°F | Resp 20 | Ht 62.0 in | Wt 186.2 lb

## 2016-03-09 DIAGNOSIS — Z7189 Other specified counseling: Secondary | ICD-10-CM

## 2016-03-09 DIAGNOSIS — Z6834 Body mass index (BMI) 34.0-34.9, adult: Secondary | ICD-10-CM | POA: Diagnosis not present

## 2016-03-09 DIAGNOSIS — F418 Other specified anxiety disorders: Secondary | ICD-10-CM | POA: Diagnosis not present

## 2016-03-09 DIAGNOSIS — Z23 Encounter for immunization: Secondary | ICD-10-CM

## 2016-03-09 DIAGNOSIS — Z7689 Persons encountering health services in other specified circumstances: Secondary | ICD-10-CM

## 2016-03-09 MED ORDER — VENLAFAXINE HCL ER 75 MG PO CP24
75.0000 mg | ORAL_CAPSULE | Freq: Every day | ORAL | 0 refills | Status: DC
Start: 2016-03-09 — End: 2016-04-06

## 2016-03-09 MED ORDER — VENLAFAXINE HCL ER 37.5 MG PO CP24: 37.5000 mg | ORAL_CAPSULE | Freq: Every day | ORAL | 0 refills | Status: DC

## 2016-03-09 NOTE — Progress Notes (Signed)
Patient ID: Cathy LatheMercedess I Bir, female  DOB: [birth year], 35 y.o.   MRN: [id number] Patient Care Team    Relationship Specialty Notifications Start End  Natalia Leatherwoodenee A Chanie Soucek, DO PCP - General Family Medicine  03/09/16   Geryl RankinsEvelyn Varnado, MD Consulting Physician Obstetrics and Gynecology  03/09/16     Subjective:  Cathy Barron is a 35 y.o.  female present for new patient establishment. All past medical history, surgical history, allergies, family history, immunizations, medications and social history were obtained  in the electronic medical record today. All recent labs, ED visits and hospitalizations within the last year were reviewed.  Anxiety: pre-pregnancy pt states she took klonopin for several years on/off averaging about 2x a week at low dose. She  had also taken vistaril in the pas, which made her very sleepy and she could not function after taking it. She reports she is overwhelmed,  can not not focus,  feels stressed out and feels like she is always trying to get things done, but never actually gets anything completed. She has taken Ativan in the past per EM system for flying phobia.  She states she was also on Adderall at one time for lack of focus, but felt after a few days it made her very angry and irritable. She was able to focus on that medication, but could not tolerate the irritable. .She has a husband and 4 children, some out of the home now, 1 less than 37 years old. She comes in today because she needs to do something, her current state can not be healthy.    Health maintenance:  Colonoscopy: no fhx, screen at 50.  Mammogram: No fhx, screen at 40. Cervical cancer screening: GYN 2015, Dr. Dario GuardianVerando.  Immunizations: tdap 2016, Influenza 2016 (encouraged yearly)--> administered.  Infectious disease screening: HIV completed DEXA: N/A  [facility] 03/09/2016 03/09/2016 12/11/2014  Decreased Interest 1 0 0  Down, Depressed, Hopeless 0 0 0  PHQ - 2 Score 1 0 0  Altered  sleeping 3 - -  Tired, decreased energy 3 - -  Change in appetite 3 - -  Feeling bad or failure about yourself  1 - -  Trouble concentrating 3 - -  Moving slowly or fidgety/restless 0 - -  Suicidal thoughts 0 - -  PHQ-9 Score 14 - -   GAD 7 : Generalized Anxiety Score 03/09/2016  Nervous, Anxious, on Edge 2  Control/stop worrying 2  Worry too much - different things 3  Trouble relaxing 3  Restless 2  Easily annoyed or irritable 3  Afraid - awful might happen 3  Total GAD 7 Score 18  Anxiety Difficulty Very difficult    Mood d/o negative today.   Immunization History  Administered Date(s) Administered  . Influenza,inj,Quad PF,36+ Mos 02/17/2015  . Tdap 06/14/2014     Past Medical History:  Diagnosis Date  . Anxiety   . Flying phobia   . GDM (gestational diabetes mellitus)   . Gestational diabetes    diet controlled  . Herpes   . Hx of varicella   . Polydactylia   . Tension headache   . Vaginal Pap smear, abnormal   . Varicose vein    No Known Allergies Past Surgical History:  Procedure Laterality Date  . NO PAST SURGERIES     Family History  Problem Relation Age of Onset  . Varicose Veins Mother   . Diabetes Father   . Liver disease Father  hepatitis  . Stroke Father    Social History   Social History  . Marital status: Married    Spouse name: N/A  . Number of children: N/A  . Years of education: N/A   Occupational History  . Not on file.   Social History Main Topics  . Smoking status: Former Games developer  . Smokeless tobacco: Never Used  . Alcohol use No  . Drug use: No  . Sexual activity: Yes    Birth control/ protection: IUD   Other Topics Concern  . Not on file   Social History Narrative   Married to [place]. Has 3 children Herbert Seta, [place] and [place]).    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    Wears her seatbelt. Smoke detector in the home.    Feels safe in her relationships.      Medication List       Accurate as of 03/09/16   9:52 AM. Always use your most recent med list.          ibuprofen 600 MG tablet Commonly known as:  ADVIL,MOTRIN Take 1 tablet (600 mg total) by mouth every 6 (six) hours as needed.   valACYclovir 500 MG tablet Commonly known as:  VALTREX TAKE 1 TABLET EVERY 12 HRS X 3 DAYS AS NEEDED ORALLY 3 DAYS   venlafaxine XR 37.5 MG 24 hr capsule Commonly known as:  EFFEXOR XR Take 1 capsule (37.5 mg total) by mouth daily with breakfast.   venlafaxine XR 75 MG 24 hr capsule Commonly known as:  EFFEXOR XR Take 1 capsule (75 mg total) by mouth daily with breakfast.        No results found for this or any previous visit (from the past 2160 hour(s)).  No results found.   ROS: 14 pt review of systems performed and negative (unless mentioned in an HPI)  Objective: BP 113/77 (BP Location: Right Arm, Patient Position: Sitting, Cuff Size: Large)   Pulse 76   Temp 97.8 F (36.6 C)   Resp 20   Ht 5\' 2"  (1.575 m)   Wt 186 lb 4 oz (84.5 kg)   SpO2 98%   BMI 34.07 kg/m  Gen: Afebrile. No acute distress. Nontoxic in appearance, well-developed, well-nourished,  Obese caucasian female.  HENT: AT. [place].  MMM Eyes:Pupils Equal Round Reactive to light, Extraocular movements intact,  Conjunctiva without redness, discharge or icterus. Neck/lymp/endocrine: Supple,no  thyromegaly CV: RRR  Chest: CTAB, no wheeze, rhonchi or crackles. Abd: Soft. NTND. BS present Skin:Warm and well-perfused. Skin intact. Neuro/Msk:  Normal gait. PERLA. EOMi. Alert. Oriented x3.  Psych: Anxious, distracted. Normal dress. Normal speech. Normal thought content and judgment.   Assessment/plan: Cathy Barron is a 35 y.o. female present for establishment of care with complaints of anxiety.  Anxiety associated with depression - Effexor taper dose provided today.  - discussed taper on/taper off medication - venlafaxine XR (EFFEXOR XR) 75 MG 24 hr capsule; Take 1 capsule (75 mg total) by mouth daily with breakfast.   Dispense: 30 capsule; Refill: 0 - f/u 4 weeks.   nfluenza vaccine administered - Flu Vaccine QUAD 36+ mos PF IM (Fluarix & Fluzone Quad PF) - flu shot administered today.    Return in about 4 weeks (around 04/06/2016), or anxiety. Greater than 30 minutes spent with patient, >50% of time spent face to face counseling patient and coordinating care.  Electronically signed by: Felix Pacini, DO [facility] [place]

## 2016-03-09 NOTE — Patient Instructions (Signed)
It was a pleasure meeting you today.  Start effexor 37.5 mg for 7 days in the morning. Then increase to 75 mg daily at day 8.  NEVER stop this medicine abruptly. We will need to see each other in 4 weeks, before medicine needs refilled.

## 2016-03-22 ENCOUNTER — Encounter: Payer: Self-pay | Admitting: Family Medicine

## 2016-04-06 ENCOUNTER — Ambulatory Visit (INDEPENDENT_AMBULATORY_CARE_PROVIDER_SITE_OTHER): Payer: BLUE CROSS/BLUE SHIELD | Admitting: Family Medicine

## 2016-04-06 ENCOUNTER — Encounter: Payer: Self-pay | Admitting: Family Medicine

## 2016-04-06 DIAGNOSIS — F418 Other specified anxiety disorders: Secondary | ICD-10-CM

## 2016-04-06 MED ORDER — VENLAFAXINE HCL ER 75 MG PO CP24: 75.0000 mg | ORAL_CAPSULE | Freq: Every day | ORAL | 0 refills | Status: DC

## 2016-04-06 NOTE — Patient Instructions (Signed)
I have called in 75 mg refills.  Before needing refills again call in, if good on 75 mg dose we refill it ans see you back in January. If needing increase in dose will call that in for you  And need to see you in December.

## 2016-04-06 NOTE — Progress Notes (Signed)
Patient ID: Cathy Barron, female  DOB: 09/18/1980, 35 y.o.   MRN: [id number] Patient Care Team    Relationship Specialty Notifications Start End  Natalia Leatherwoodenee A Kuneff, DO PCP - General Family Medicine  03/09/16   Geryl RankinsEvelyn Varnado, MD Consulting Physician Obstetrics and Gynecology  03/09/16     Subjective:  Cathy Barron is a 35 y.o.  female present for anxiety follow up:   Anxiety: Pt present for anxiety follow up. She has tapered to Effexor 75 mg dose and has not noticed any side effects. She states she has only noticed a small benefit currently.   Prior note:  pre-pregnancy pt states she took klonopin for several years on/off averaging about 2x a week at low dose. She  had also taken vistaril in the pas, which made her very sleepy and she could not function after taking it. She reports she is overwhelmed,  can not not focus,  feels stressed out and feels like she is always trying to get things done, but never actually gets anything completed. She has taken Ativan in the past per EM system for flying phobia.  She states she was also on Adderall at one time for lack of focus, but felt after a few days it made her very angry and irritable. She was able to focus on that medication, but could not tolerate the irritable. .She has a husband and 4 children, some out of the home now, 1 less than 37 years old. She comes in today because she needs to do something, her current state can not be healthy.    [facility] 03/09/2016 03/09/2016 12/11/2014  Decreased Interest 1 0 0  Down, Depressed, Hopeless 0 0 0  PHQ - 2 Score 1 0 0  Altered sleeping 3 - -  Tired, decreased energy 3 - -  Change in appetite 3 - -  Feeling bad or failure about yourself  1 - -  Trouble concentrating 3 - -  Moving slowly or fidgety/restless 0 - -  Suicidal thoughts 0 - -  PHQ-9 Score 14 - -   GAD 7 : Generalized Anxiety Score 03/09/2016  Nervous, Anxious, on Edge 2  Control/stop worrying 2  Worry too much  - different things 3  Trouble relaxing 3  Restless 2  Easily annoyed or irritable 3  Afraid - awful might happen 3  Total GAD 7 Score 18  Anxiety Difficulty Very difficult    Mood d/o negative today.   Immunization History  Administered Date(s) Administered  . Influenza,inj,Quad PF,36+ Mos 02/17/2015, 03/09/2016  . Tdap 06/14/2014     Past Medical History:  Diagnosis Date  . Anxiety   . Flying phobia   . GDM (gestational diabetes mellitus)   . Gestational diabetes    diet controlled  . Herpes   . Hx of varicella   . Polydactylia   . Tension headache   . Vaginal Pap smear, abnormal   . Varicose vein    No Known Allergies Past Surgical History:  Procedure Laterality Date  . NO PAST SURGERIES     Family History  Problem Relation Age of Onset  . Varicose Veins Mother   . Diabetes Father   . Liver disease Father     hepatitis  . Stroke Father    Social History   Social History  . Marital status: Married    Spouse name: N/A  . Number of children: N/A  . Years of education: N/A  Occupational History  . Not on file.   Social History Main Topics  . Smoking status: Former Smoker    Quit date: 2007  . Smokeless tobacco: Never Used  . Alcohol use No  . Drug use: No  . Sexual activity: Yes    Partners: Male    Birth control/ protection: IUD   Other Topics Concern  . Not on file   Social History Narrative   Married to [place]. Has 3 children Herbert Seta, [place] and [place]).    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    Wears her seatbelt. Smoke detector in the home.    Feels safe in her relationships.      Medication List       Accurate as of 04/06/16 10:03 AM. Always use your most recent med list.          ibuprofen 600 MG tablet Commonly known as:  ADVIL,MOTRIN Take 1 tablet (600 mg total) by mouth every 6 (six) hours as needed.   valACYclovir 500 MG tablet Commonly known as:  VALTREX TAKE 1 TABLET EVERY 12 HRS X 3 DAYS AS NEEDED ORALLY 3 DAYS     venlafaxine XR 75 MG 24 hr capsule Commonly known as:  EFFEXOR XR Take 1 capsule (75 mg total) by mouth daily with breakfast.        No results found for this or any previous visit (from the past 2160 hour(s)).  No results found.   ROS: 14 pt review of systems performed and negative (unless mentioned in an HPI)  Objective: BP 102/69 (BP Location: Right Arm, Patient Position: Sitting, Cuff Size: Large)   Pulse 74   Temp 97.6 F (36.4 C)   Resp 20   Ht 5\' 2"  (1.575 m)   Wt 182 lb 8 oz (82.8 kg)   SpO2 98%   BMI 33.38 kg/m  Gen: Afebrile. No acute distress. Nontoxic in appearance, well-developed, well-nourished,  Obese caucasian female.  HENT: AT. [place].  MMM Eyes:Pupils Equal Round Reactive to light, Extraocular movements intact,  Conjunctiva without redness, discharge or icterus. Neuro/Msk:  Normal gait. PERLA. EOMi. Alert. Oriented x3.  Psych: Anxious, distracted. Normal dress. Normal speech. Normal thought content and judgment.   Assessment/plan: KEIRY KOWAL is a 35 y.o. female present for establishment of care with complaints of anxiety.  Anxiety associated with depression - Tolerating medication. Continue 75 mg dosing for 1 additional month to obtain full effectiveness of 75 mg dose. Refills provided today. If doing well follow up in 3 months. If needing dose adjustment in 1 month, she will call in and we will increase and see 1 month after adjustment.  - venlafaxine XR (EFFEXOR XR) 75 MG 24 hr capsule; Take 1 capsule (75 mg total) by mouth daily with breakfast.  Dispense: 30 capsule; Refill: 0 - 3 months if no adjustment needed.    Electronically signed by: Felix Pacini, DO [facility] [place]

## 2016-04-30 ENCOUNTER — Ambulatory Visit: Payer: BLUE CROSS/BLUE SHIELD | Admitting: Family Medicine

## 2016-05-10 ENCOUNTER — Other Ambulatory Visit: Payer: Self-pay | Admitting: Family Medicine

## 2016-05-10 ENCOUNTER — Other Ambulatory Visit: Payer: Self-pay

## 2016-05-10 MED ORDER — VALACYCLOVIR HCL 500 MG PO TABS: ORAL_TABLET | ORAL | 1 refills | Status: AC

## 2016-05-10 NOTE — Telephone Encounter (Signed)
Patient requesting medication. We have not filled in the past.

## 2016-05-10 NOTE — Progress Notes (Signed)
Refilled valtrex  

## 2016-06-09 ENCOUNTER — Ambulatory Visit (INDEPENDENT_AMBULATORY_CARE_PROVIDER_SITE_OTHER): Payer: BLUE CROSS/BLUE SHIELD | Admitting: Family Medicine

## 2016-06-09 ENCOUNTER — Ambulatory Visit (HOSPITAL_BASED_OUTPATIENT_CLINIC_OR_DEPARTMENT_OTHER)
Admission: RE | Admit: 2016-06-09 | Discharge: 2016-06-09 | Disposition: A | Discharge status: Home / Self Care | Payer: BLUE CROSS/BLUE SHIELD | Source: Ambulatory Visit | Attending: Family Medicine | Admitting: Family Medicine

## 2016-06-09 ENCOUNTER — Encounter: Payer: Self-pay | Admitting: Family Medicine

## 2016-06-09 VITALS — BP 108/73 | HR 77 | Temp 98.0°F | Resp 20 | Ht 62.0 in | Wt 190.5 lb

## 2016-06-09 DIAGNOSIS — G8929 Other chronic pain: Secondary | ICD-10-CM

## 2016-06-09 DIAGNOSIS — M79644 Pain in right finger(s): Secondary | ICD-10-CM

## 2016-06-09 DIAGNOSIS — M25572 Pain in left ankle and joints of left foot: Secondary | ICD-10-CM

## 2016-06-09 DIAGNOSIS — M25561 Pain in right knee: Secondary | ICD-10-CM

## 2016-06-09 DIAGNOSIS — M7732 Calcaneal spur, left foot: Secondary | ICD-10-CM | POA: Insufficient documentation

## 2016-06-09 DIAGNOSIS — M79672 Pain in left foot: Secondary | ICD-10-CM | POA: Diagnosis not present

## 2016-06-09 NOTE — Patient Instructions (Addendum)
X[facility] have been [facility]. [facility] I will call you with results once available.   Please help us help you:  It is a privilege to be able to take care of great patients such as yourself. We are honored [facility]. Below you will find basic instructions that you may need to access in the future. Please help us help you by reading the instructions, which cover many of the frequent questions we experience.   Prescription refills and request:  -In order to allow more efficient response time, please call your pharmacy for all refills. They will forward the request electronically to us. This allows for the quickest possible response. Request left on a nurse line can take longer to refill, since these are checked as time allows between office patients and other phone calls.  - refill request can take up to 3-5 working days to complete.  - If request is sent electronically and request is appropiate, it is usually completed in 1-2 business days.  - all patients will need to be seen routinely for all chronic medical conditions requiring prescription medications (see follow-up below). If you are overdue for follow up on your condition, you will be asked to make an appointment and we will call in enough medication to cover you until your appointment (up to 30 days).  - all controlled substances will require a face to face visit to request/refill.  - if you desire your prescriptions to go through a new pharmacy, and have an active script at original pharmacy, you will need to call your pharmacy and have scripts transferred to new pharmacy. This is completed between the pharmacy locations and not by your provider.    Results: If any images or labs were ordered, it can take up to 1 week to get results depending on the test ordered and the lab/facility running and resulting the test. - Normal or stable results, which do not need further  discussion, will be released to your mychart immediately with attached note to you. A call will not be generated for normal results. Please make certain to sign up for mychart. If you have questions on how to activate your mychart you can call the front office.  - If your results need further discussion, our office will attempt to contact you via phone, and if unable to reach you after 2 attempts, we will release your abnormal result to your mychart with instructions.  - All results will be automatically released in mychart after 1 week.  - Your provider will provide you with explanation and instruction on all relevant material in your results. Please keep in mind, results and labs may appear confusing or abnormal to the untrained eye, but it does not mean they are actually abnormal for you personally. If you have any questions about your results that are not covered, or you desire more detailed explanation than what was provided, you should make an appointment with your provider to do so.   Our office handles many outgoing and incoming calls daily. If we have not contacted you within 1 week about your results, please check your mychart to see if there is a message first and if not, then contact our office.  In helping with this matter, you help decrease call volume, and therefore allow us to be able to respond to patients needs more efficiently.   Acute office visits (sick visit):  An acute visit is intended  for a new problem and are scheduled in shorter time slots to allow schedule openings for patients with new problems. This is the appropriate visit to discuss a new problem. In order to provide you with excellent quality medical care with proper time for you to explain your problem, have an exam and receive treatment with instructions, these appointments should be limited to one new problem per visit. If you experience a new problem, in which you desire to be addressed, please make an acute office visit,  we save openings on the schedule to accommodate you. Please do not save your new problem for any other type of visit, let us take care of it properly and quickly for you.   Follow up visits:  Depending on your condition(s) your provider will need to see you routinely in order to provide you with quality care and prescribe medication(s). Most chronic conditions (Example: hypertension, Diabetes, depression/anxiety... etc), require visits a couple times a year. Your provider will instruct you on proper follow up for your personal medical conditions and history. Please make certain to make follow up appointments for your condition as instructed. Failing to do so could result in lapse in your medication treatment/refills. If you request a refill, and are overdue to be seen on a condition, we will always provide you with a 30 day script (once) to allow you time to schedule.    Medicare wellness (well visit): - we have a wonderful Nurse Selena Batten(Kim), that will meet with you and provide you will yearly medicare wellness visits. These visits should occur yearly (can not be scheduled less than 1 calendar year apart) and cover preventive health, immunizations, advance directives and screenings you are entitled to yearly through your medicare benefits. Do not miss out on your entitled benefits, this is when medicare will pay for these benefits to be ordered for you.  These are strongly encouraged by your provider and is the appropriate type of visit to make certain you are up to date with all preventive health benefits. If you have not had your medicare wellness exam in the last 12 months, please make certain to schedule one by calling the office and schedule your medicare wellness with Selena BattenKim as soon as possible.   Yearly physical (well visit):  - Adults are recommended to be seen yearly for physicals. Check with your insurance and date of your last physical, most insurances require one calendar year between physicals.  Physicals include all preventive health topics, screenings, medical exam and labs that are appropriate for gender/age and history. You may have fasting labs needed at this visit. This is a well visit (not a sick visit), acute topics should not be covered during this visit.  - Pediatric patients are seen more frequently when they are younger. Your provider will advise you on well child visit timing that is appropriate for your their age. - This is not a medicare wellness visit. Medicare wellness exams do not have an exam portion to the visit. Some medicare companies allow for a physical, some do not allow a yearly physical. If your medicare allows a yearly physical you can schedule the medicare wellness with our nurse Selena BattenKim and have your physical with your provider after, on the same day. Please check with insurance for your full benefits.   Late Policy/No Shows:  - all new patients should arrive 15-30 minutes earlier than appointment to allow us time  to  obtain all personal demographics,  insurance information and for you to complete office  paperwork. - All established patients should arrive 10-15 minutes earlier than appointment time to update all information and be checked in .  - In our best efforts to run on time, if you are late for your appointment you will be asked to either reschedule or if able, we will work you back into the schedule. There will be a wait time to work you back in the schedule,  depending on availability.  - If you are unable to make it to your appointment as scheduled, please call 24 hours ahead of time to allow [place] to fill the time slot with someone else who needs to be seen. If you do not cancel your appointment ahead of time, you may be charged a no show fee.
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
## 2016-06-10 ENCOUNTER — Telehealth: Payer: Self-pay | Admitting: Family Medicine

## 2016-06-10 DIAGNOSIS — G8929 Other chronic pain: Secondary | ICD-10-CM

## 2016-06-10 DIAGNOSIS — M25572 Pain in left ankle and joints of left foot: Secondary | ICD-10-CM

## 2016-06-10 DIAGNOSIS — M25561 Pain in right knee: Secondary | ICD-10-CM

## 2016-06-10 MED ORDER — MELOXICAM 15 MG PO TABS: 15.0000 mg | ORAL_TABLET | Freq: Every day | ORAL | 1 refills | Status: AC

## 2016-06-10 NOTE — Telephone Encounter (Signed)
I have referred her to sports medicine, since nothing appears to have a surgical need at the moment. I can not promise they will see her for all three issues on first appt. She may also need to later be referred to an orthopedic if surgery is needed but orthopedics each specialize on different areas of the body, so one orthopedic would not likely treat or perform surgery on  all three body parts (if needed). Since the ankle exam was abnormal, and she is having difficulty walking etc, will place referral for that today with sports med

## 2016-06-10 NOTE — Telephone Encounter (Signed)
She would like to be referred out for all 3 issues she states it doesn't matter if its Ortho or Sports med as long as they can address these things at same location.

## 2016-06-10 NOTE — Telephone Encounter (Signed)
Please call pt: Her xrays are all normal. No fractures or causes for her discomfort.  - next step would be:    - for her knee: we could try a steroid injection in her knee and use of a compression sleeve. If she desires this approach she can make an appt. OR we can refer to ortho/sports med for further eval.     - her finger is not fractured. She would benefit from anti-inflammatory daily and if continues to cause issues, would consider ortho/sports med referral for further management.     - ankle: xray did not show cause of her pain. Given her exam on her ankle I would like her to first try daily anti-inflammatory and I would like her to see ortho or sports med for this issue.    - therefore:  - Ask her if she would like to see an orthopedic or sports med doc, and I will place the referral for her ankle. I believe sports med would be a good start, but if she prefers ortho that is ok.  - I have called in mobic for her to start daily with food.  - If she would like to have a knee injection here she can make an appt and we could complete for her, or she can wait until she gets into specialist (for ankle) and discuss.  Please advise

## 2016-06-16 ENCOUNTER — Institutional Professional Consult (permissible substitution): Payer: BLUE CROSS/BLUE SHIELD | Admitting: Family Medicine

## 2017-05-04 ENCOUNTER — Ambulatory Visit (HOSPITAL_BASED_OUTPATIENT_CLINIC_OR_DEPARTMENT_OTHER)
Admission: RE | Admit: 2017-05-04 | Discharge: 2017-05-04 | Disposition: A | Discharge status: Home / Self Care | Payer: BLUE CROSS/BLUE SHIELD | Source: Ambulatory Visit | Attending: Family Medicine | Admitting: Family Medicine

## 2017-05-04 ENCOUNTER — Other Ambulatory Visit: Payer: Self-pay | Admitting: Family Medicine

## 2017-05-04 DIAGNOSIS — R0989 Other specified symptoms and signs involving the circulatory and respiratory systems: Secondary | ICD-10-CM

## 2017-05-04 DIAGNOSIS — R198 Other specified symptoms and signs involving the digestive system and abdomen: Secondary | ICD-10-CM

## 2017-05-04 DIAGNOSIS — F458 Other somatoform disorders: Secondary | ICD-10-CM | POA: Diagnosis not present

## 2017-07-02 ENCOUNTER — Other Ambulatory Visit: Payer: Self-pay | Admitting: Family Medicine

## 2017-07-02 DIAGNOSIS — R229 Localized swelling, mass and lump, unspecified: Secondary | ICD-10-CM

## 2017-09-16 ENCOUNTER — Ambulatory Visit (HOSPITAL_BASED_OUTPATIENT_CLINIC_OR_DEPARTMENT_OTHER): Admission: RE | Admit: 2017-09-16 | Payer: BLUE CROSS/BLUE SHIELD | Source: Ambulatory Visit

## 2018-07-19 DIAGNOSIS — N83202 Unspecified ovarian cyst, left side: Secondary | ICD-10-CM | POA: Insufficient documentation

## 2018-07-19 DIAGNOSIS — Z8632 Personal history of gestational diabetes: Secondary | ICD-10-CM | POA: Insufficient documentation

## 2020-08-05 ENCOUNTER — Other Ambulatory Visit (HOSPITAL_BASED_OUTPATIENT_CLINIC_OR_DEPARTMENT_OTHER): Payer: Self-pay | Admitting: Primary Care

## 2020-08-05 ENCOUNTER — Emergency Department (HOSPITAL_BASED_OUTPATIENT_CLINIC_OR_DEPARTMENT_OTHER): Admission: EM | Admit: 2020-08-05 | Discharge: 2020-08-05 | Discharge status: Absent without leave | Payer: 59

## 2020-08-05 ENCOUNTER — Encounter (HOSPITAL_BASED_OUTPATIENT_CLINIC_OR_DEPARTMENT_OTHER): Payer: Self-pay

## 2020-08-05 ENCOUNTER — Ambulatory Visit (HOSPITAL_BASED_OUTPATIENT_CLINIC_OR_DEPARTMENT_OTHER)
Admission: RE | Admit: 2020-08-05 | Discharge: 2020-08-05 | Disposition: A | Discharge status: Home / Self Care | Payer: 59 | Source: Ambulatory Visit | Attending: Primary Care | Admitting: Primary Care

## 2020-08-05 ENCOUNTER — Other Ambulatory Visit: Payer: Self-pay

## 2020-08-05 DIAGNOSIS — R1030 Lower abdominal pain, unspecified: Secondary | ICD-10-CM

## 2020-08-05 DIAGNOSIS — R1031 Right lower quadrant pain: Secondary | ICD-10-CM

## 2020-08-05 MED ORDER — IOHEXOL 300 MG/ML  SOLN
100.0000 mL | Freq: Once | INTRAMUSCULAR | Status: AC | PRN
  Administered 2020-08-05: 100 mL via INTRAVENOUS

## 2020-08-05 MED ORDER — IOHEXOL 9 MG/ML PO SOLN
500.0000 mL | ORAL | Status: AC
  Administered 2020-08-05 (×2): 500 mL via ORAL

## 2021-10-20 ENCOUNTER — Other Ambulatory Visit: Payer: Self-pay

## 2021-10-20 DIAGNOSIS — I8391 Asymptomatic varicose veins of right lower extremity: Secondary | ICD-10-CM

## 2021-10-28 ENCOUNTER — Telehealth (INDEPENDENT_AMBULATORY_CARE_PROVIDER_SITE_OTHER): Payer: 59 | Admitting: Psychiatry

## 2021-10-28 ENCOUNTER — Encounter (HOSPITAL_COMMUNITY): Payer: Self-pay | Admitting: Psychiatry

## 2021-10-28 DIAGNOSIS — F331 Major depressive disorder, recurrent, moderate: Secondary | ICD-10-CM

## 2021-10-28 DIAGNOSIS — F4310 Post-traumatic stress disorder, unspecified: Secondary | ICD-10-CM

## 2021-10-28 DIAGNOSIS — F411 Generalized anxiety disorder: Secondary | ICD-10-CM

## 2021-10-28 DIAGNOSIS — Z789 Other specified health status: Secondary | ICD-10-CM | POA: Diagnosis not present

## 2021-10-28 MED ORDER — SERTRALINE HCL 50 MG PO TABS: 50.0000 mg | ORAL_TABLET | Freq: Every day | ORAL | 0 refills | Status: DC
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
## 2021-11-04 ENCOUNTER — Ambulatory Visit (HOSPITAL_COMMUNITY)
Admission: RE | Admit: 2021-11-04 | Discharge: 2021-11-04 | Disposition: A | Discharge status: Home / Self Care | Payer: 59 | Source: Ambulatory Visit | Attending: Vascular Surgery | Admitting: Vascular Surgery

## 2021-11-04 ENCOUNTER — Ambulatory Visit: Payer: 59 | Admitting: Physician Assistant

## 2021-11-04 VITALS — BP 141/89 | HR 74 | Temp 98.4°F | Resp 20 | Ht 62.0 in | Wt 170.3 lb

## 2021-11-04 DIAGNOSIS — A6000 Herpesviral infection of urogenital system, unspecified: Secondary | ICD-10-CM | POA: Insufficient documentation

## 2021-11-04 DIAGNOSIS — F419 Anxiety disorder, unspecified: Secondary | ICD-10-CM | POA: Insufficient documentation

## 2021-11-04 DIAGNOSIS — K5792 Diverticulitis of intestine, part unspecified, without perforation or abscess without bleeding: Secondary | ICD-10-CM | POA: Insufficient documentation

## 2021-11-04 DIAGNOSIS — F4329 Adjustment disorder with other symptoms: Secondary | ICD-10-CM | POA: Insufficient documentation

## 2021-11-04 DIAGNOSIS — N926 Irregular menstruation, unspecified: Secondary | ICD-10-CM | POA: Insufficient documentation

## 2021-11-04 DIAGNOSIS — M792 Neuralgia and neuritis, unspecified: Secondary | ICD-10-CM | POA: Diagnosis not present

## 2021-11-04 DIAGNOSIS — M5430 Sciatica, unspecified side: Secondary | ICD-10-CM | POA: Insufficient documentation

## 2021-11-04 DIAGNOSIS — G8929 Other chronic pain: Secondary | ICD-10-CM | POA: Insufficient documentation

## 2021-11-04 DIAGNOSIS — I872 Venous insufficiency (chronic) (peripheral): Secondary | ICD-10-CM | POA: Diagnosis not present

## 2021-11-04 DIAGNOSIS — I8391 Asymptomatic varicose veins of right lower extremity: Secondary | ICD-10-CM | POA: Diagnosis present

## 2021-11-04 DIAGNOSIS — F909 Attention-deficit hyperactivity disorder, unspecified type: Secondary | ICD-10-CM | POA: Insufficient documentation

## 2021-11-04 DIAGNOSIS — N939 Abnormal uterine and vaginal bleeding, unspecified: Secondary | ICD-10-CM | POA: Insufficient documentation

## 2021-11-04 DIAGNOSIS — R1310 Dysphagia, unspecified: Secondary | ICD-10-CM | POA: Insufficient documentation

## 2021-11-04 DIAGNOSIS — F331 Major depressive disorder, recurrent, moderate: Secondary | ICD-10-CM | POA: Insufficient documentation

## 2021-11-04 DIAGNOSIS — R0989 Other specified symptoms and signs involving the circulatory and respiratory systems: Secondary | ICD-10-CM | POA: Insufficient documentation

## 2021-11-04 NOTE — Progress Notes (Signed)
Office Note     CC:  follow up Requesting Provider:  Mitzi Hansen, NP  HPI: Cathy Barron is a 41 y.o. (1981-05-21) female who presents for evaluation of spider/varicose veins of right leg.  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    She describes a hypersensitive, pins-and-needles, stabbing, burning pain on the right lateral lower leg and up the lateral portion of her right leg into her buttock.  She only recently started wearing compression which helps some.  She does not elevate her legs during the day.  She denies any edema, venous ulcerations, trauma, or prior vascular intervention.  She has received a "shot" for her hip pain in the past.  She denies tobacco use.   Past Medical History:  Diagnosis Date   Anxiety    Flying phobia    GDM (gestational diabetes mellitus)    Gestational diabetes    diet controlled   Herpes    Hx of varicella    Polydactylia    Tension headache    Vaginal Pap smear, abnormal    Varicose vein     Past Surgical History:  Procedure Laterality Date   NO PAST SURGERIES      Social History   Socioeconomic History   Marital status: Married    Spouse name: Not on file   Number of children: Not on file   Years of education: Not on file   Highest education level: Not on file  Occupational History   Not on file  Tobacco Use   Smoking status: Former    Types: Cigarettes    Quit date: 2007    Years since quitting: 16.4    Passive exposure: Never   Smokeless tobacco: Never  Substance and Sexual Activity   Alcohol use: No   Drug use: No   Sexual activity: Yes    Partners: Male    Birth control/protection: I.U.D.  Other Topics Concern   Not on file  Social History Narrative   Married to [place]. Has 3 children Herbert Seta, [place] and [place]).    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    Wears her seatbelt. Smoke detector in the home.    Feels safe in her relationships.    Social Determinants of Health   Financial Resource  Strain: Not on file  Food Insecurity: Not on file  Transportation Needs: Not on file  Physical Activity: Not on file  Stress: Not on file  Social Connections: Not on file  Intimate Partner Violence: Not on file    Family History  Problem Relation Age of Onset   Varicose Veins Mother    Diabetes Father    Liver disease Father        hepatitis   Stroke Father     Current Outpatient Medications  Medication Sig Dispense Refill   buPROPion (WELLBUTRIN XL) 150 MG 24 hr tablet Take 150 mg by mouth daily.     buPROPion (WELLBUTRIN XL) 300 MG 24 hr tablet 1 tablet in the morning     ibuprofen (ADVIL,MOTRIN) 600 MG tablet Take 1 tablet (600 mg total) by mouth every 6 (six) hours as needed. 30 tablet 2   meloxicam (MOBIC) 15 MG tablet Take 1 tablet (15 mg total) by mouth daily. 90 tablet 1   sertraline (ZOLOFT) 100 MG tablet Take 100 mg by mouth daily.     sertraline (ZOLOFT) 50 MG tablet Take 1 tablet (50 mg total) by mouth daily. This is in addition  to 100mg  zoloft. Total dose is 150mg  30 tablet 0   valACYclovir (VALTREX) 500 MG tablet TAKE 1 TABLET EVERY 12 HRS X 3 DAYS AS NEEDED ORALLY 3 DAYS 20 tablet 1   No current facility-administered medications for this visit.    No Known Allergies   REVIEW OF SYSTEMS:   [X]  denotes positive finding, [ ]  denotes negative finding Cardiac  Comments:  Chest pain or chest pressure:    Shortness of breath upon exertion:    Short of breath when lying flat:    Irregular heart rhythm:        Vascular    Pain in calf, thigh, or hip brought on by ambulation:    Pain in feet at night that wakes you up from your sleep:     Blood clot in your veins:    Leg swelling:         Pulmonary    Oxygen at home:    Productive cough:     Wheezing:         Neurologic    Sudden weakness in arms or legs:     Sudden numbness in arms or legs:     Sudden onset of difficulty speaking or slurred speech:    Temporary loss of vision in one eye:     Problems  with dizziness:         Gastrointestinal    Blood in stool:     Vomited blood:         Genitourinary    Burning when urinating:     Blood in urine:        Psychiatric    Major depression:         Hematologic    Bleeding problems:    Problems with blood clotting too easily:        Skin    Rashes or ulcers:        Constitutional    Fever or chills:      PHYSICAL EXAMINATION:  Vitals:   11/04/21 1336  BP: (!) 141/89  Pulse: 74  Resp: 20  Temp: 98.4 F (36.9 C)  TempSrc: Temporal  SpO2: 98%  Weight: 170 lb 4.8 oz (77.2 kg)  Height: 5\' 2"  (1.575 m)    General:  WDWN in NAD; vital signs documented above Gait: Not observed HENT: WNL, normocephalic Pulmonary: normal non-labored breathing , without Rales, rhonchi,  wheezing Cardiac: regular HR, Abdomen: soft, NT, no masses Skin: without rashes Vascular Exam/Pulses:  Right Left  Radial 2+ (normal) 2+ (normal)  DP 2+ (normal) 2+ (normal)  PT 2+ (normal) 2+ (normal)   Extremities: without ischemic changes, without Gangrene , without cellulitis; without open wounds;  Musculoskeletal: no muscle wasting or atrophy  Neurologic: A&O X 3;  No focal weakness or paresthesias are detected Psychiatric:  The pt has Normal affect.   Non-Invasive Vascular Imaging:   Right lower extremity venous reflux study negative for DVT Common femoral vein reflux Mid and proximal thigh GSV reflux     ASSESSMENT/PLAN:: 41 y.o. female here for evaluation of right leg discomfort and varicose veins  -Based on the description of the discomfort, it sounds like patient is experiencing nerve pain in the sciatic distribution.  She does have an area of varicose veins below her knee however this is unrelated to her chief complaint.  She has mild deep and superficial venous reflux however no indication for laser ablation.  I reck recommended continued use of knee-high compression 15 to 20  mmHg.  I also encouraged and demonstrated proper elevation  of her legs which should be performed 10 to 15 minutes a couple times a day.  She can also use NSAIDs for discomfort associated with her veins and nerve pain symptoms.  She will follow-up with her primary doctor for further management of what sounds like neurologic   Emilie Rutter, PA-C [facility] [phone]  Clinic MD:   Randie Heinz

## 2021-11-20 ENCOUNTER — Encounter: Payer: Self-pay | Admitting: Orthopaedic Surgery

## 2021-11-20 ENCOUNTER — Ambulatory Visit (INDEPENDENT_AMBULATORY_CARE_PROVIDER_SITE_OTHER): Payer: 59

## 2021-11-20 ENCOUNTER — Ambulatory Visit: Payer: 59 | Admitting: Orthopaedic Surgery

## 2021-11-20 VITALS — Ht 62.0 in | Wt 180.0 lb

## 2021-11-20 DIAGNOSIS — M545 Low back pain, unspecified: Secondary | ICD-10-CM

## 2021-11-20 DIAGNOSIS — G8929 Other chronic pain: Secondary | ICD-10-CM

## 2021-11-20 NOTE — Progress Notes (Signed)
Office Visit Note   Patient: Cathy Barron           Date of Birth: [birth year]           MRN: [id number] Visit Date: 11/20/2021              Requested by: Genia Hotter, FNP [address] [place],  [place] [address] PCP: Stamey, Verda Cumins, FNP   Assessment & Plan: Visit Diagnoses:  1. Chronic right-sided low back pain, unspecified whether sciatica present     Plan: Patient has done physical therapy for this problem and has not noticed any improvement.  At this point we will obtain MRI of the lumbar spine to rule out structural abnormalities.  Follow-up after the MRI.  Follow-Up Instructions: No follow-ups on file.   Orders:  Orders Placed This Encounter  Procedures   XR Lumbar Spine 2-3 Views   No orders of the defined types were placed in this encounter.     Procedures: No procedures performed   Clinical Data: No additional findings.   Subjective: Chief Complaint  Patient presents with   Lower Back - Pain    HPI Patient is a 41 year old female here for right leg pain for 18 years.  Has low back pain that travels down the right leg.  Started after she fell onto her buttocks a year and a half ago.  Has taken prednisone and Mobic and Tylenol without prolonged relief.  Review of Systems  Constitutional: Negative.   HENT: Negative.    Eyes: Negative.   Respiratory: Negative.    Cardiovascular: Negative.   Endocrine: Negative.   Musculoskeletal: Negative.   Neurological: Negative.   Hematological: Negative.   Psychiatric/Behavioral: Negative.    All other systems reviewed and are negative.    Objective: Vital Signs: Ht 5\' 2"  (1.575 m)   Wt 180 lb (81.6 kg)   BMI 32.92 kg/m   Physical Exam Vitals and nursing note reviewed.  Constitutional:      Appearance: She is well-developed.  HENT:     Head: Normocephalic and atraumatic.  Pulmonary:     Effort: Pulmonary effort is normal.  Abdominal:     Palpations: Abdomen is soft.  Musculoskeletal:      Cervical back: Neck supple.  Skin:    General: Skin is warm.     Capillary Refill: Capillary refill takes less than 2 seconds.  Neurological:     Mental Status: She is alert and oriented to person, place, and time.  Psychiatric:        Behavior: Behavior normal.        Thought Content: Thought content normal.        Judgment: Judgment normal.     Ortho Exam Examination of the right hip is unremarkable.  No sciatic tension signs.  Lumbar spine exam is nonfocal. Specialty Comments:  No specialty comments available.  Imaging: XR Lumbar Spine 2-3 Views  Result Date: 11/20/2021 Mild lumbar facet disease    PMFS History: Patient Active Problem List   Diagnosis Date Noted   Stress and adjustment reaction 11/04/2021   Sciatica 11/04/2021   Moderate recurrent major depression (HCC) 11/04/2021   Menstrual disorder 11/04/2021   Globus sensation 11/04/2021   Genital herpes simplex 11/04/2021   Dysphagia 11/04/2021   Diverticulitis 11/04/2021   Chronic pain 11/04/2021   Attention deficit hyperactivity disorder 11/04/2021   Anxiety 11/04/2021   Vaginal bleeding 11/04/2021   History of gestational diabetes 07/19/2018   Left  ovarian cyst 07/19/2018   Anxiety associated with depression 03/09/2016   BMI 34.0-34.9,adult 03/09/2016   HSV 07/04/2008   Past Medical History:  Diagnosis Date   Anxiety    Flying phobia    GDM (gestational diabetes mellitus)    Gestational diabetes    diet controlled   Herpes    Hx of varicella    Polydactylia    Tension headache    Vaginal Pap smear, abnormal    Varicose vein     Family History  Problem Relation Age of Onset   Varicose Veins Mother    Diabetes Father    Liver disease Father        hepatitis   Stroke Father     Past Surgical History:  Procedure Laterality Date   NO PAST SURGERIES     Social History   Occupational History   Not on file  Tobacco Use   Smoking status: Former    Types: Cigarettes    Quit date: 2007     Years since quitting: 16.4    Passive exposure: Never   Smokeless tobacco: Never  Substance and Sexual Activity   Alcohol use: No   Drug use: No   Sexual activity: Yes    Partners: Male    Birth control/protection: I.U.D.

## 2021-11-22 ENCOUNTER — Other Ambulatory Visit (HOSPITAL_COMMUNITY): Payer: Self-pay | Admitting: Psychiatry

## 2021-11-24 ENCOUNTER — Ambulatory Visit (INDEPENDENT_AMBULATORY_CARE_PROVIDER_SITE_OTHER): Payer: 59 | Admitting: Licensed Clinical Social Worker

## 2021-11-24 DIAGNOSIS — F411 Generalized anxiety disorder: Secondary | ICD-10-CM

## 2021-11-24 DIAGNOSIS — F331 Major depressive disorder, recurrent, moderate: Secondary | ICD-10-CM

## 2021-11-24 DIAGNOSIS — F4310 Post-traumatic stress disorder, unspecified: Secondary | ICD-10-CM | POA: Diagnosis not present
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
## 2021-11-27 ENCOUNTER — Other Ambulatory Visit (HOSPITAL_COMMUNITY): Payer: Self-pay

## 2021-11-27 MED ORDER — SERTRALINE HCL 100 MG PO TABS: 100.0000 mg | ORAL_TABLET | Freq: Every day | ORAL | 0 refills | Status: AC

## 2021-12-03 ENCOUNTER — Ambulatory Visit (HOSPITAL_COMMUNITY): Payer: 59 | Admitting: Psychiatry

## 2021-12-06 ENCOUNTER — Ambulatory Visit (HOSPITAL_COMMUNITY)
Admission: RE | Admit: 2021-12-06 | Discharge: 2021-12-06 | Disposition: A | Discharge status: Home / Self Care | Payer: 59 | Source: Ambulatory Visit | Attending: Orthopaedic Surgery | Admitting: Orthopaedic Surgery

## 2021-12-06 DIAGNOSIS — M545 Low back pain, unspecified: Secondary | ICD-10-CM | POA: Diagnosis present

## 2021-12-06 DIAGNOSIS — G8929 Other chronic pain: Secondary | ICD-10-CM | POA: Diagnosis present

## 2021-12-08 ENCOUNTER — Ambulatory Visit (INDEPENDENT_AMBULATORY_CARE_PROVIDER_SITE_OTHER): Payer: 59 | Admitting: Licensed Clinical Social Worker

## 2021-12-08 DIAGNOSIS — F4310 Post-traumatic stress disorder, unspecified: Secondary | ICD-10-CM

## 2021-12-08 DIAGNOSIS — F331 Major depressive disorder, recurrent, moderate: Secondary | ICD-10-CM

## 2021-12-08 DIAGNOSIS — F411 Generalized anxiety disorder: Secondary | ICD-10-CM

## 2021-12-16 ENCOUNTER — Telehealth (HOSPITAL_COMMUNITY): Payer: 59 | Admitting: Psychiatry

## 2021-12-24 ENCOUNTER — Ambulatory Visit (HOSPITAL_COMMUNITY): Payer: 59 | Admitting: Licensed Clinical Social Worker

## 2021-12-24 NOTE — Progress Notes (Signed)
Therapist contacted patient through My Chart and text and she did not respond. Session is a no show 

## 2021-12-30 ENCOUNTER — Encounter (HOSPITAL_COMMUNITY): Payer: Self-pay

## 2021-12-30 ENCOUNTER — Encounter (HOSPITAL_COMMUNITY): Payer: Self-pay | Admitting: Licensed Clinical Social Worker

## 2021-12-30 ENCOUNTER — Telehealth (INDEPENDENT_AMBULATORY_CARE_PROVIDER_SITE_OTHER): Payer: 59 | Admitting: Licensed Clinical Social Worker

## 2021-12-30 DIAGNOSIS — F411 Generalized anxiety disorder: Secondary | ICD-10-CM

## 2021-12-30 DIAGNOSIS — F4310 Post-traumatic stress disorder, unspecified: Secondary | ICD-10-CM

## 2021-12-30 DIAGNOSIS — F331 Major depressive disorder, recurrent, moderate: Secondary | ICD-10-CM

## 2021-12-30 NOTE — Progress Notes (Signed)
Therapist contacted patient by My Chart and text and she did not respond. Session is a no show

## 2021-12-31 ENCOUNTER — Ambulatory Visit: Payer: 59 | Admitting: Orthopaedic Surgery

## 2022-01-06 ENCOUNTER — Telehealth (HOSPITAL_COMMUNITY): Payer: 59 | Admitting: Licensed Clinical Social Worker

## 2022-02-17 ENCOUNTER — Other Ambulatory Visit (HOSPITAL_COMMUNITY): Payer: Self-pay | Admitting: Psychiatry

## 2022-02-21 ENCOUNTER — Other Ambulatory Visit (HOSPITAL_COMMUNITY): Payer: Self-pay | Admitting: Psychiatry

## 2022-02-23 ENCOUNTER — Encounter: Payer: Self-pay | Admitting: Orthopaedic Surgery

## 2022-02-23 ENCOUNTER — Ambulatory Visit (INDEPENDENT_AMBULATORY_CARE_PROVIDER_SITE_OTHER): Payer: Self-pay | Admitting: Orthopaedic Surgery

## 2022-02-23 DIAGNOSIS — G8929 Other chronic pain: Secondary | ICD-10-CM

## 2022-02-23 DIAGNOSIS — M545 Low back pain, unspecified: Secondary | ICD-10-CM

## 2022-02-23 NOTE — Progress Notes (Signed)
Office Visit Note   Patient: Cathy Barron           Date of Birth: 01-06-1981           MRN: [id number] Visit Date: 02/23/2022              Requested by: Genia Hotter, FNP [address] [place],  [place] [address] PCP: Stamey, Verda Cumins, FNP   Assessment & Plan: Visit Diagnoses:  1. Chronic right-sided low back pain, unspecified whether sciatica present     Plan: Impression is chronic right-sided low back pain and right lower extremity radiculopathy with recent MRI findings which show a mild disc bulge L4-5.  At this point, we have discussed referral to Dr. Alvester Morin for [facility].  Patient is agreeable to plan.  She will follow-up with [place] as needed.  Call with concerns or questions.  Follow-Up Instructions: Return if symptoms worsen or fail to improve.   Orders:  No orders of the defined types were placed in this encounter.  No orders of the defined types were placed in this encounter.     Procedures: No procedures performed   Clinical Data: No additional findings.   Subjective: Chief Complaint  Patient presents with   Lower Back - Follow-up    MRI review    HPI patient is a pleasant 41 year old female who comes in today to discuss MRI results of her lumbar spine.  She has been dealing with chronic right-sided low back pain with right lower extremity radiculopathy for 18 years which worsened after sustaining a fall a year and a half ago.  Recent MRI of the lumbar spine shows minimal disc bulging L4-5 but nothing more.  She has previously taken steroids and muscle relaxers as well as tried a course of physical therapy without relief.  No previous epidural steroid injection.     Objective: Vital Signs: There were no vitals taken for this visit.    Ortho Exam unchanged lumbar spine exam  Specialty Comments:  No specialty comments available.  Imaging: No new imaging   PMFS History: Patient Active Problem List   Diagnosis Date Noted   Stress and adjustment  reaction 11/04/2021   Sciatica 11/04/2021   Moderate recurrent major depression (HCC) 11/04/2021   Menstrual disorder 11/04/2021   Globus sensation 11/04/2021   Genital herpes simplex 11/04/2021   Dysphagia 11/04/2021   Diverticulitis 11/04/2021   Chronic pain 11/04/2021   Attention deficit hyperactivity disorder 11/04/2021   Anxiety 11/04/2021   Vaginal bleeding 11/04/2021   History of gestational diabetes 07/19/2018   Left ovarian cyst 07/19/2018   Anxiety associated with depression 03/09/2016   BMI 34.0-34.9,adult 03/09/2016   HSV 07/04/2008   Past Medical History:  Diagnosis Date   Anxiety    Flying phobia    GDM (gestational diabetes mellitus)    Gestational diabetes    diet controlled   Herpes    Hx of varicella    Polydactylia    Tension headache    Vaginal Pap smear, abnormal    Varicose vein     Family History  Problem Relation Age of Onset   Varicose Veins Mother    Diabetes Father    Liver disease Father        hepatitis   Stroke Father     Past Surgical History:  Procedure Laterality Date   NO PAST SURGERIES     Social History   Occupational History   Not on file  Tobacco Use  Smoking status: Former    Types: Cigarettes    Quit date: 2007    Years since quitting: 16.7    Passive exposure: Never   Smokeless tobacco: Never  Substance and Sexual Activity   Alcohol use: No   Drug use: No   Sexual activity: Yes    Partners: Male    Birth control/protection: I.U.D.

## 2022-02-26 DIAGNOSIS — F418 Other specified anxiety disorders: Secondary | ICD-10-CM | POA: Diagnosis not present

## 2022-02-26 DIAGNOSIS — R69 Illness, unspecified: Secondary | ICD-10-CM | POA: Diagnosis not present

## 2022-03-22 ENCOUNTER — Telehealth: Payer: Self-pay | Admitting: Physical Medicine and Rehabilitation

## 2022-03-22 ENCOUNTER — Telehealth: Payer: Self-pay | Admitting: Orthopedic Surgery

## 2022-03-22 NOTE — Telephone Encounter (Signed)
Ms. Borromeo is calling to schedule steroid injections for her back.  Her call back # is [phone].

## 2022-03-22 NOTE — Telephone Encounter (Signed)
Called pt to get sch. LVM#1  

## 2022-03-23 NOTE — Telephone Encounter (Signed)
IC appt scheduled.  

## 2022-04-07 ENCOUNTER — Ambulatory Visit: Payer: Self-pay

## 2022-04-07 ENCOUNTER — Ambulatory Visit: Payer: 59 | Admitting: Physical Medicine and Rehabilitation

## 2022-04-07 VITALS — BP 145/92 | HR 68

## 2022-04-07 DIAGNOSIS — M5416 Radiculopathy, lumbar region: Secondary | ICD-10-CM

## 2022-04-07 MED ORDER — METHYLPREDNISOLONE ACETATE 80 MG/ML IJ SUSP
40.0000 mg | Freq: Once | INTRAMUSCULAR | Status: AC
  Administered 2022-04-07: 40 mg

## 2022-04-07 NOTE — Progress Notes (Signed)
Numeric Pain Rating Scale and Functional Assessment Average Pain 4   In the last MONTH (on 0-10 scale) has pain interfered with the following?  1. General activity like being  able to carry out your everyday physical activities such as walking, climbing stairs, carrying groceries, or moving a chair?  Rating(10)   +Driver, -BT, -Dye Allergies.  Pain can vary and radiates down right leg all the way down to foot. Takes Ibuprofen and Meloxicam

## 2022-04-07 NOTE — Patient Instructions (Signed)

## 2022-04-14 NOTE — Progress Notes (Signed)
Cathy Barron - 41 y.o. female MRN [id number]  Date of birth: 15-Jan-1981  Office Visit Note: Visit Date: 04/07/2022 PCP: Sheran Spine, FNP Referred by: Sheran Spine, FNP  Subjective: Chief Complaint  Patient presents with   Lower Back - Pain   HPI:  Cathy Barron is a 41 y.o. female who comes in today at the request of Dr. Eduard Roux for planned Right L4-5 Lumbar Interlaminar epidural steroid injection with fluoroscopic guidance.  The patient has failed conservative care including home exercise, medications, time and activity modification.  This injection will be diagnostic and hopefully therapeutic.  Please see requesting physician notes for further details and justification.    ROS Otherwise per HPI.  Assessment & Plan: Visit Diagnoses:    ICD-10-CM   1. Lumbar radiculopathy  M54.16 XR C-ARM NO REPORT    Epidural Steroid injection    methylPREDNISolone acetate (DEPO-MEDROL) injection 40 mg      Plan: No additional findings.   Meds & Orders:  Meds ordered this encounter  Medications   methylPREDNISolone acetate (DEPO-MEDROL) injection 40 mg    Orders Placed This Encounter  Procedures   XR C-ARM NO REPORT   Epidural Steroid injection    Follow-up: Return for visit to requesting provider as needed.   Procedures: No procedures performed  Lumbar Epidural Steroid Injection - Interlaminar Approach with Fluoroscopic Guidance  Patient: Cathy Barron      Date of Birth: 04/16/1981 MRN: [id number] PCP: Sheran Spine, FNP      Visit Date: 04/07/2022   Universal Protocol:     Consent Given By: the patient  Position: PRONE  Additional Comments: Vital signs were monitored before and after the procedure. Patient was prepped and draped in the usual sterile fashion. The correct patient, procedure, and site was verified.   Injection Procedure Details:   Procedure diagnoses: Lumbar radiculopathy [M54.16]   Meds Administered:  Meds ordered this  encounter  Medications   methylPREDNISolone acetate (DEPO-MEDROL) injection 40 mg     Laterality: Right  Location/Site:  L4-5  Needle: 3.5 in., 20 ga. Tuohy  Needle Placement: Paramedian epidural  Findings:   -Comments: Excellent flow of contrast into the epidural space.  Procedure Details: Using a paramedian approach from the side mentioned above, the region overlying the inferior lamina was localized under fluoroscopic visualization and the soft tissues overlying this structure were infiltrated with 4 ml. of 1% Lidocaine without Epinephrine. The Tuohy needle was inserted into the epidural space using a paramedian approach.   The epidural space was localized using loss of resistance along with counter oblique bi-planar fluoroscopic views.  After negative aspirate for air, blood, and CSF, a 2 ml. volume of Isovue-250 was injected into the epidural space and the flow of contrast was observed. Radiographs were obtained for documentation purposes.    The injectate was administered into the level noted above.   Additional Comments:  The patient tolerated the procedure well Dressing: 2 x 2 sterile gauze and Band-Aid    Post-procedure details: Patient was observed during the procedure. Post-procedure instructions were reviewed.  Patient left the clinic in stable condition.   Clinical History: MRI LUMBAR SPINE WITHOUT CONTRAST   TECHNIQUE: Multiplanar, multisequence MR imaging of the lumbar spine was performed. No intravenous contrast was administered.   COMPARISON:  Abdominopelvic CT 08/05/2020   FINDINGS: Segmentation: Conventional anatomy assumed, with the last open disc space designated L5-S1.Concordant with previous imaging.   Alignment:  Physiologic.   Vertebrae: No  worrisome osseous lesion, acute fracture or pars defect. The visualized sacroiliac joints appear unremarkable.   Conus medullaris: Extends to the L1 level and appears normal.   Paraspinal and other  soft tissues: No significant paraspinal findings.   Disc levels:   The disc height and hydration are maintained at each level. There is no disc herniation, spinal stenosis or nerve root encroachment. Minimal disc bulging at L4-5.   IMPRESSION: 1. Minimal disc bulging at L4-5. 2. No disc herniation, spinal stenosis or nerve root encroachment.     Electronically Signed   By: Richardean Sale M.D.   On: 12/07/2021 11:54     Objective:  VS:  HT:    WT:   BMI:     BP:(!) 145/92  HR:68bpm  TEMP: ( )  RESP:  Physical Exam Vitals and nursing note reviewed.  Constitutional:      General: She is not in acute distress.    Appearance: Normal appearance. She is not ill-appearing.  HENT:     Head: Normocephalic and atraumatic.     Right Ear: External ear normal.     Left Ear: External ear normal.  Eyes:     Extraocular Movements: Extraocular movements intact.  Cardiovascular:     Rate and Rhythm: Normal rate.     Pulses: Normal pulses.  Pulmonary:     Effort: Pulmonary effort is normal. No respiratory distress.  Abdominal:     General: There is no distension.     Palpations: Abdomen is soft.  Musculoskeletal:        General: Tenderness present.     Cervical back: Neck supple.     Right lower leg: No edema.     Left lower leg: No edema.     Comments: Patient has good distal strength with no pain over the greater trochanters.  No clonus or focal weakness.  Skin:    Findings: No erythema, lesion or rash.  Neurological:     General: No focal deficit present.     Mental Status: She is alert and oriented to person, place, and time.     Sensory: No sensory deficit.     Motor: No weakness or abnormal muscle tone.     Coordination: Coordination normal.  Psychiatric:        Mood and Affect: Mood normal.        Behavior: Behavior normal.      Imaging: No results found.

## 2022-04-14 NOTE — Procedures (Signed)
Lumbar Epidural Steroid Injection - Interlaminar Approach with Fluoroscopic Guidance  Patient: Cathy Barron      Date of Birth: Jan 24, 1981 MRN: [id number] PCP: Sheran Spine, FNP      Visit Date: 04/07/2022   Universal Protocol:     Consent Given By: the patient  Position: PRONE  Additional Comments: Vital signs were monitored before and after the procedure. Patient was prepped and draped in the usual sterile fashion. The correct patient, procedure, and site was verified.   Injection Procedure Details:   Procedure diagnoses: Lumbar radiculopathy [M54.16]   Meds Administered:  Meds ordered this encounter  Medications   methylPREDNISolone acetate (DEPO-MEDROL) injection 40 mg     Laterality: Right  Location/Site:  L4-5  Needle: 3.5 in., 20 ga. Tuohy  Needle Placement: Paramedian epidural  Findings:   -Comments: Excellent flow of contrast into the epidural space.  Procedure Details: Using a paramedian approach from the side mentioned above, the region overlying the inferior lamina was localized under fluoroscopic visualization and the soft tissues overlying this structure were infiltrated with 4 ml. of 1% Lidocaine without Epinephrine. The Tuohy needle was inserted into the epidural space using a paramedian approach.   The epidural space was localized using loss of resistance along with counter oblique bi-planar fluoroscopic views.  After negative aspirate for air, blood, and CSF, a 2 ml. volume of Isovue-250 was injected into the epidural space and the flow of contrast was observed. Radiographs were obtained for documentation purposes.    The injectate was administered into the level noted above.   Additional Comments:  The patient tolerated the procedure well Dressing: 2 x 2 sterile gauze and Band-Aid    Post-procedure details: Patient was observed during the procedure. Post-procedure instructions were reviewed.  Patient left the clinic in stable  condition.

## 2022-05-28 DIAGNOSIS — M792 Neuralgia and neuritis, unspecified: Secondary | ICD-10-CM | POA: Diagnosis not present

## 2022-05-28 DIAGNOSIS — R69 Illness, unspecified: Secondary | ICD-10-CM | POA: Diagnosis not present

## 2022-05-28 DIAGNOSIS — B009 Herpesviral infection, unspecified: Secondary | ICD-10-CM | POA: Diagnosis not present

## 2022-07-08 DIAGNOSIS — R030 Elevated blood-pressure reading, without diagnosis of hypertension: Secondary | ICD-10-CM | POA: Diagnosis not present

## 2022-07-08 DIAGNOSIS — M5431 Sciatica, right side: Secondary | ICD-10-CM | POA: Diagnosis not present

## 2022-07-08 DIAGNOSIS — R079 Chest pain, unspecified: Secondary | ICD-10-CM | POA: Diagnosis not present

## 2023-01-23 IMAGING — CT CT ABD-PELV W/ CM
2 of 5 series · 16 of 46 positions shown, 18 images · IV contrast (Omnipaque)
Comparison: None.

CLINICAL DATA: Right lower quadrant pain with diarrhea

EXAM:
CT ABDOMEN AND PELVIS WITH CONTRAST
TECHNIQUE: Multidetector CT imaging of the abdomen and pelvis was performed
using the standard protocol following bolus administration of
intravenous contrast.
CONTRAST:  100mL OMNIPAQUE IOHEXOL 300 MG/ML  SOLN

[Series 2: axial st · axial · 0.81mm/px · z∈[+700,+1115]mm · 13 of 93 slices shown, 15 images]
[im 5/93  soft-tissue]
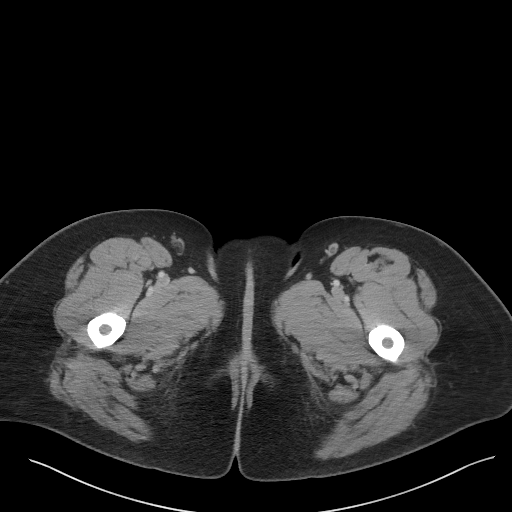
[im 5/93  bone]
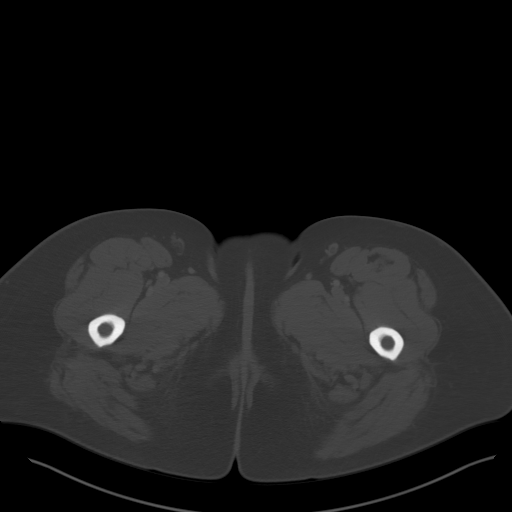
[im 15/93  soft-tissue]
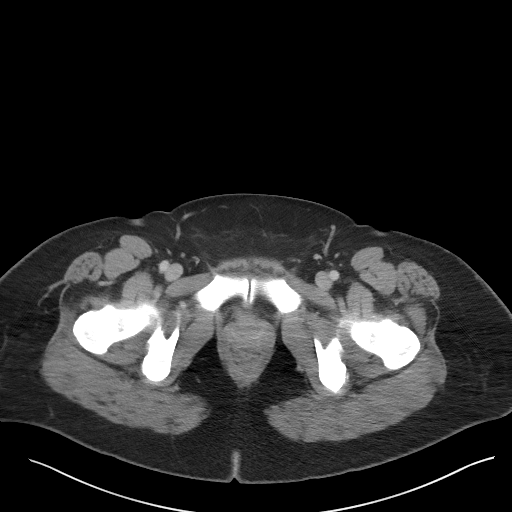
[im 20/93  soft-tissue]
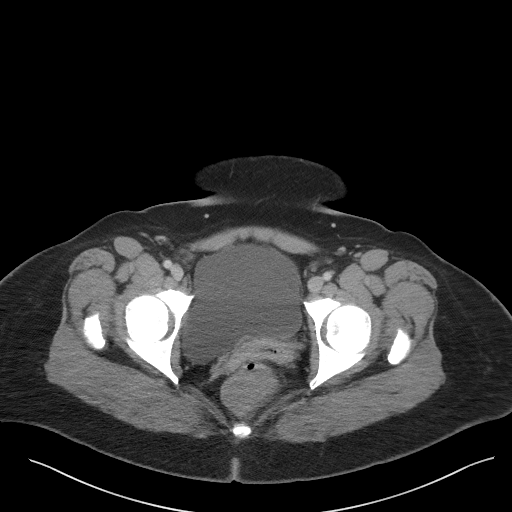
[im 25/93  soft-tissue]
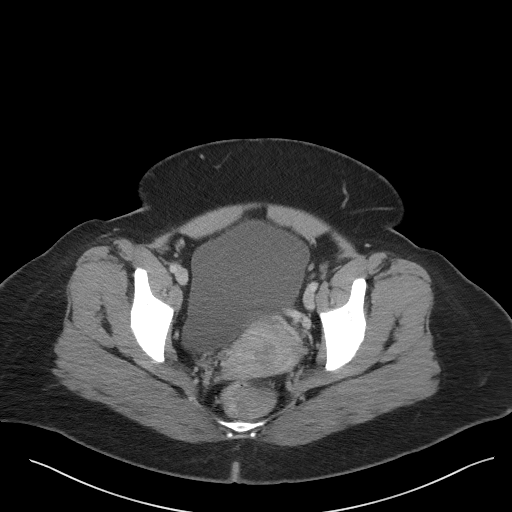
[im 34/93  soft-tissue]
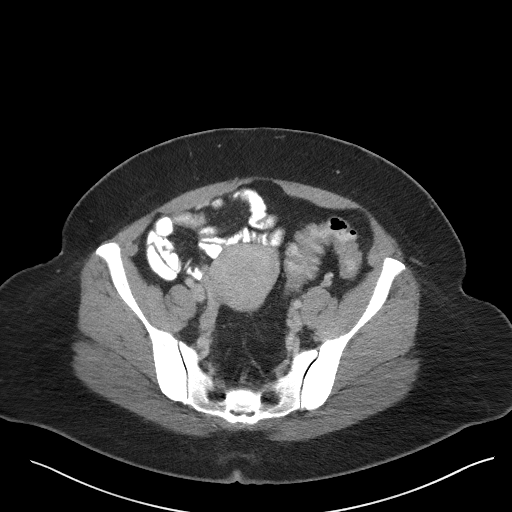
[im 39/93  soft-tissue]
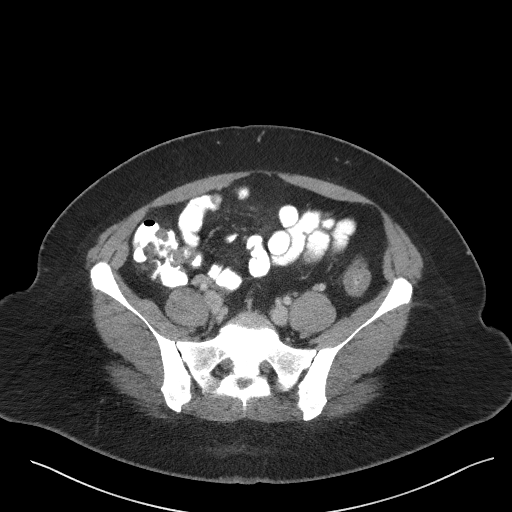
[im 49/93  soft-tissue]
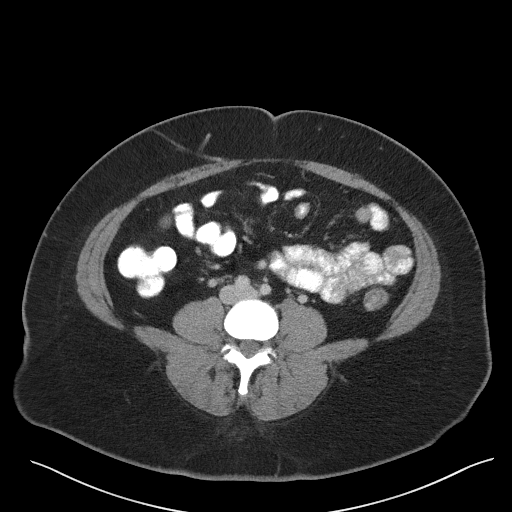
[im 54/93  soft-tissue]
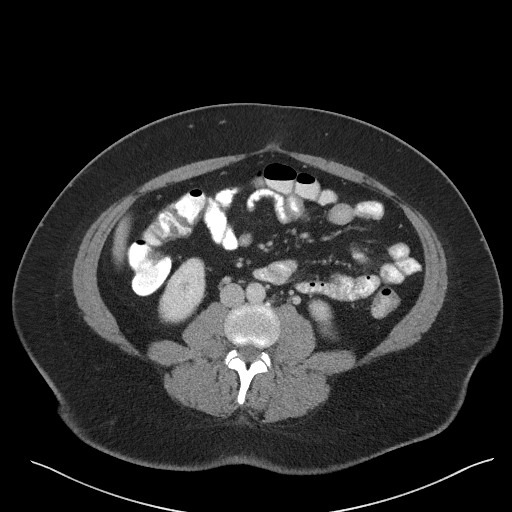
[im 59/93  soft-tissue]
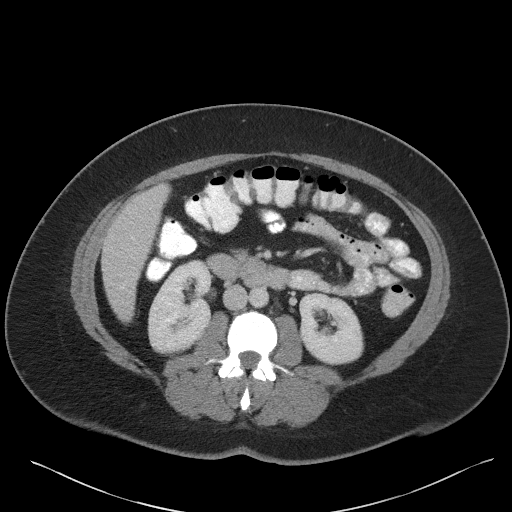
[im 59/93  bone]
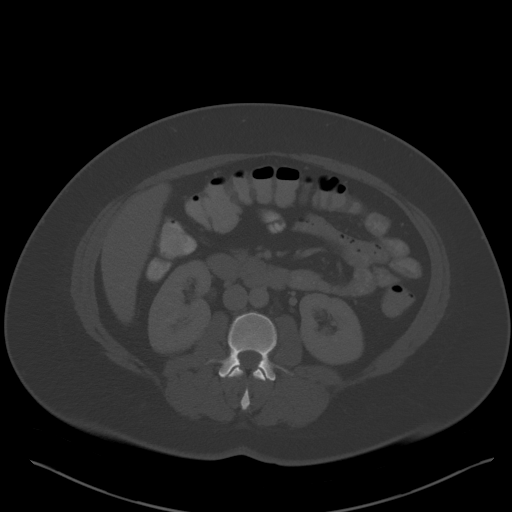
[im 68/93  soft-tissue]
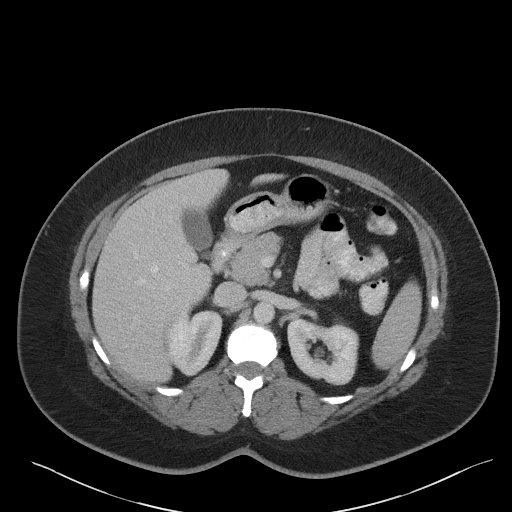
[im 73/93  soft-tissue]
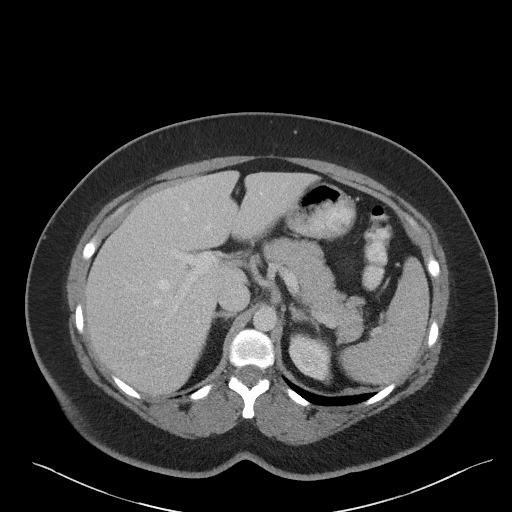
[im 78/93  soft-tissue]
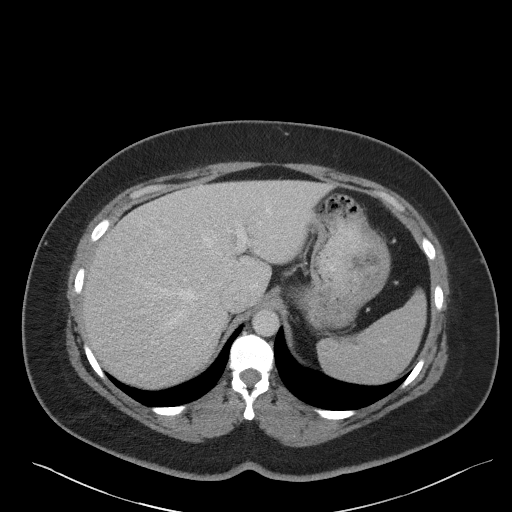
[im 88/93  soft-tissue]
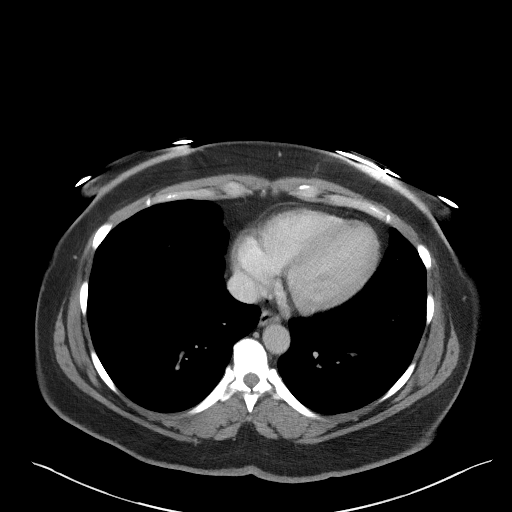

[Series 5: coronal st · coronal · 0.69mm/px · 3 of 101 slices shown]
[im 34/101  soft-tissue]
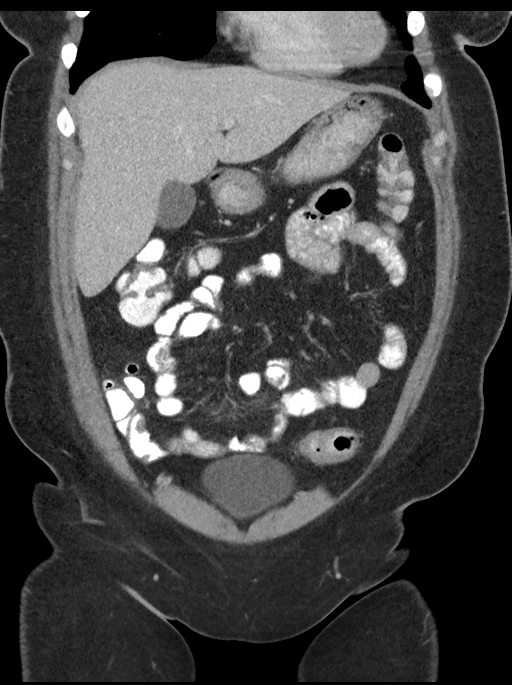
[im 45/101  soft-tissue]
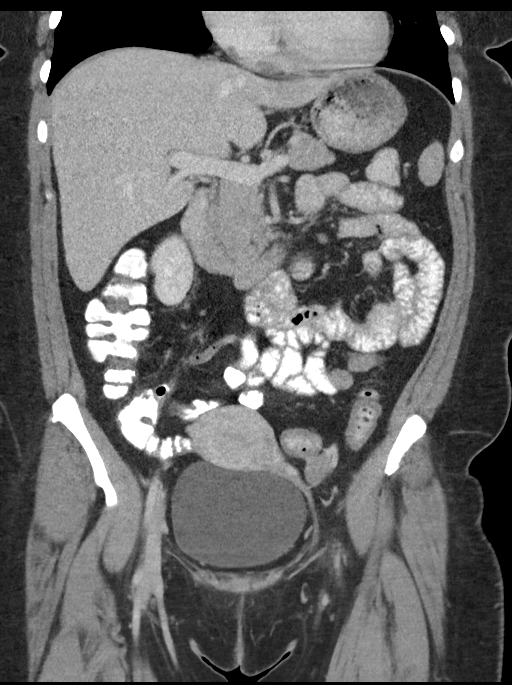
[im 56/101  soft-tissue]
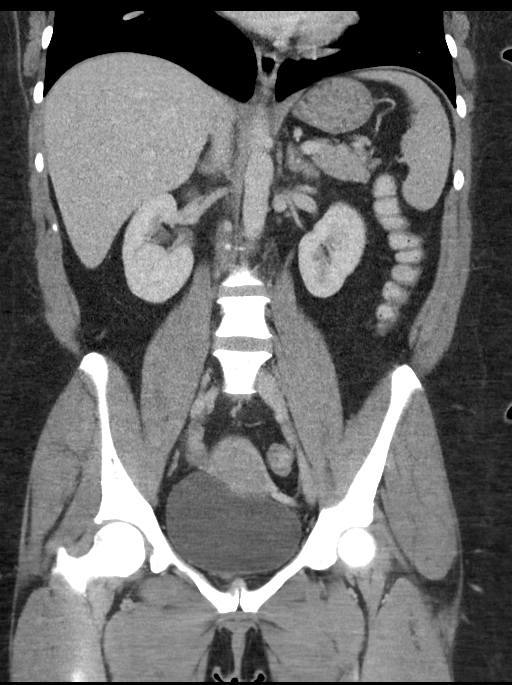

[16 of 46 positions shown; findings below may reference images not displayed]

FINDINGS: Lower chest: No acute abnormality.

Hepatobiliary: No focal liver abnormality is seen. No gallstones,
gallbladder wall thickening, or biliary dilatation.

Pancreas: Unremarkable. No pancreatic ductal dilatation or
surrounding inflammatory changes.

Spleen: Normal in size without focal abnormality.

Adrenals/Urinary Tract: Adrenal glands are unremarkable. Kidneys are
normal, without renal calculi, focal lesion, or hydronephrosis.
Bladder is unremarkable.

Stomach/Bowel: Stomach is nonenlarged. No dilated small bowel.
Negative appendix. Suspicion of minimal terminal ileal wall
thickening with probable mild wall thickening of the sigmoid colon.

Vascular/Lymphatic: Nonaneurysmal aorta.  No suspicious nodes.

Reproductive: Uterus and bilateral adnexa are unremarkable.

Other: Negative for free air or free fluid.

Musculoskeletal: No acute or significant osseous findings.
IMPRESSION: 1. Negative for acute appendicitis.
2. Suspicion of minimal terminal ileal wall thickening and probable
mild wall thickening of the sigmoid colon, findings could be
secondary to a mild enterocolitis of infectious or inflammatory
etiology.

## 2023-05-06 ENCOUNTER — Other Ambulatory Visit (HOSPITAL_BASED_OUTPATIENT_CLINIC_OR_DEPARTMENT_OTHER): Payer: Self-pay | Admitting: Family Medicine

## 2023-05-06 ENCOUNTER — Ambulatory Visit (HOSPITAL_BASED_OUTPATIENT_CLINIC_OR_DEPARTMENT_OTHER)
Admission: RE | Admit: 2023-05-06 | Discharge: 2023-05-06 | Disposition: A | Discharge status: Home / Self Care | Payer: Commercial Managed Care - HMO | Source: Ambulatory Visit | Attending: Family Medicine | Admitting: Family Medicine

## 2023-05-06 ENCOUNTER — Encounter (HOSPITAL_BASED_OUTPATIENT_CLINIC_OR_DEPARTMENT_OTHER): Payer: Self-pay | Admitting: Radiology

## 2023-05-06 DIAGNOSIS — M25511 Pain in right shoulder: Secondary | ICD-10-CM | POA: Insufficient documentation

## 2023-05-06 DIAGNOSIS — Z1231 Encounter for screening mammogram for malignant neoplasm of breast: Secondary | ICD-10-CM

## 2023-06-01 NOTE — Progress Notes (Unsigned)
Office Visit Note   Patient: Cathy Barron           Date of Birth: 1981-02-02           MRN: [id number] Visit Date: 06/02/2023              Requested by: Genia Hotter, FNP [address] [facility] [address] [place],  [place] [address] PCP: Scarlett Presto, Verda Cumins, FNP   Assessment & Plan: Visit Diagnoses: No diagnosis found.  Plan: ***  Follow-Up Instructions: No follow-ups on file.   Orders:  No orders of the defined types were placed in this encounter.  No orders of the defined types were placed in this encounter.     Procedures: No procedures performed   Clinical Data: No additional findings.   Subjective: No chief complaint on file.   HPI  Review of Systems  Constitutional: Negative.   HENT: Negative.    Eyes: Negative.   Respiratory: Negative.    Cardiovascular: Negative.   Endocrine: Negative.   Musculoskeletal:  Positive for arthralgias.  Neurological: Negative.   Hematological: Negative.   Psychiatric/Behavioral: Negative.    All other systems reviewed and are negative.   Objective: Vital Signs: There were no vitals taken for this visit.  Physical Exam Vitals and nursing note reviewed.  Constitutional:      Appearance: She is well-developed.  HENT:     Head: Atraumatic.     Nose: Nose normal.  Eyes:     Extraocular Movements: Extraocular movements intact.  Cardiovascular:     Pulses: Normal pulses.  Pulmonary:     Effort: Pulmonary effort is normal.  Abdominal:     Palpations: Abdomen is soft.  Musculoskeletal:     Cervical back: Neck supple.  Skin:    General: Skin is warm.     Capillary Refill: Capillary refill takes less than 2 seconds.  Neurological:     Mental Status: She is alert. Mental status is at baseline.  Psychiatric:        Behavior: Behavior normal.        Thought Content: Thought content normal.        Judgment: Judgment normal.   Ortho Exam  Specialty Comments:  MRI LUMBAR SPINE WITHOUT CONTRAST    TECHNIQUE: Multiplanar, multisequence MR imaging of the lumbar spine was performed. No intravenous contrast was administered.   COMPARISON:  Abdominopelvic CT 08/05/2020   FINDINGS: Segmentation: Conventional anatomy assumed, with the last open disc space designated L5-S1.Concordant with previous imaging.   Alignment:  Physiologic.   Vertebrae: No worrisome osseous lesion, acute fracture or pars defect. The visualized sacroiliac joints appear unremarkable.   Conus medullaris: Extends to the L1 level and appears normal.   Paraspinal and other soft tissues: No significant paraspinal findings.   Disc levels:   The disc height and hydration are maintained at each level. There is no disc herniation, spinal stenosis or nerve root encroachment. Minimal disc bulging at L4-5.   IMPRESSION: 1. Minimal disc bulging at L4-5. 2. No disc herniation, spinal stenosis or nerve root encroachment.     Electronically Signed   By: Carey Bullocks M.D.   On: 12/07/2021 11:54  Imaging: No results found.   PMFS History: Patient Active Problem List   Diagnosis Date Noted  . Stress and adjustment reaction 11/04/2021  . Sciatica 11/04/2021  . Moderate recurrent major depression (HCC) 11/04/2021  . Menstrual disorder 11/04/2021  . Globus sensation 11/04/2021  . Genital herpes simplex 11/04/2021  .  Dysphagia 11/04/2021  . Diverticulitis 11/04/2021  . Chronic pain 11/04/2021  . Attention deficit hyperactivity disorder 11/04/2021  . Anxiety 11/04/2021  . Vaginal bleeding 11/04/2021  . History of gestational diabetes 07/19/2018  . Left ovarian cyst 07/19/2018  . Anxiety associated with depression 03/09/2016  . BMI 34.0-34.9,adult 03/09/2016  . HSV 07/04/2008   Past Medical History:  Diagnosis Date  . Anxiety   . Flying phobia   . GDM (gestational diabetes mellitus)   . Gestational diabetes    diet controlled  . Herpes   . Hx of varicella   . Polydactylia   . Tension headache    . Vaginal Pap smear, abnormal   . Varicose vein     Family History  Problem Relation Age of Onset  . Varicose Veins Mother   . Diabetes Father   . Liver disease Father        hepatitis  . Stroke Father     Past Surgical History:  Procedure Laterality Date  . NO PAST SURGERIES     Social History   Occupational History  . Not on file  Tobacco Use  . Smoking status: Former    Current packs/day: 0.00    Types: Cigarettes    Quit date: 2007    Years since quitting: 17.9    Passive exposure: Never  . Smokeless tobacco: Never  Substance and Sexual Activity  . Alcohol use: No  . Drug use: No  . Sexual activity: Yes    Partners: Male    Birth control/protection: I.U.D.

## 2023-06-02 ENCOUNTER — Ambulatory Visit: Payer: Commercial Managed Care - HMO | Admitting: Orthopaedic Surgery

## 2023-06-02 DIAGNOSIS — G8929 Other chronic pain: Secondary | ICD-10-CM | POA: Diagnosis not present

## 2023-06-02 DIAGNOSIS — M25511 Pain in right shoulder: Secondary | ICD-10-CM | POA: Diagnosis not present

## 2023-06-02 MED ORDER — METHYLPREDNISOLONE ACETATE 40 MG/ML IJ SUSP
40.0000 mg | INTRAMUSCULAR | Status: AC | PRN
  Administered 2023-06-02: 40 mg via INTRA_ARTICULAR

## 2023-06-02 MED ORDER — LIDOCAINE HCL 1 % IJ SOLN
3.0000 mL | INTRAMUSCULAR | Status: AC | PRN
  Administered 2023-06-02: 3 mL

## 2023-06-02 MED ORDER — BUPIVACAINE HCL 0.5 % IJ SOLN
3.0000 mL | INTRAMUSCULAR | Status: AC | PRN
  Administered 2023-06-02: 3 mL via INTRA_ARTICULAR

## 2023-06-02 NOTE — Addendum Note (Signed)
Addended by: Mayra Reel on: 06/02/2023 05:38 PM   Modules accepted: Level of Service

## 2024-04-16 ENCOUNTER — Encounter: Payer: Self-pay | Admitting: Radiology
